# Patient Record
Sex: Male | Born: 1935 | Race: Black or African American | Hispanic: No | Smoking: Former smoker
Health system: Southern US, Community
[De-identification: ages and names within clinical notes are randomized; demographics above are authoritative.]

## PROBLEM LIST (undated history)

## (undated) DIAGNOSIS — C801 Malignant (primary) neoplasm, unspecified: Secondary | ICD-10-CM

## (undated) DIAGNOSIS — F0390 Unspecified dementia without behavioral disturbance: Secondary | ICD-10-CM

## (undated) DIAGNOSIS — K7460 Unspecified cirrhosis of liver: Secondary | ICD-10-CM

## (undated) DIAGNOSIS — I10 Essential (primary) hypertension: Secondary | ICD-10-CM

## (undated) DIAGNOSIS — K219 Gastro-esophageal reflux disease without esophagitis: Secondary | ICD-10-CM

## (undated) HISTORY — DX: Gastro-esophageal reflux disease without esophagitis: K21.9

## (undated) HISTORY — PX: APPENDECTOMY: SHX54

---

## 1999-04-15 ENCOUNTER — Encounter (INDEPENDENT_AMBULATORY_CARE_PROVIDER_SITE_OTHER): Payer: Self-pay | Admitting: *Deleted

## 1999-04-15 ENCOUNTER — Ambulatory Visit (HOSPITAL_COMMUNITY): Admission: RE | Admit: 1999-04-15 | Discharge: 1999-04-15 | Payer: Self-pay | Admitting: *Deleted

## 1999-08-10 ENCOUNTER — Encounter: Admission: RE | Admit: 1999-08-10 | Discharge: 1999-11-08 | Payer: Self-pay | Admitting: *Deleted

## 2001-07-04 ENCOUNTER — Ambulatory Visit (HOSPITAL_COMMUNITY): Admission: RE | Admit: 2001-07-04 | Discharge: 2001-07-04 | Payer: Self-pay | Admitting: *Deleted

## 2001-07-04 ENCOUNTER — Encounter (INDEPENDENT_AMBULATORY_CARE_PROVIDER_SITE_OTHER): Payer: Self-pay | Admitting: Specialist

## 2001-12-24 ENCOUNTER — Ambulatory Visit (HOSPITAL_COMMUNITY): Admission: RE | Admit: 2001-12-24 | Discharge: 2001-12-24 | Payer: Self-pay | Admitting: Internal Medicine

## 2002-12-24 ENCOUNTER — Ambulatory Visit: Admission: RE | Admit: 2002-12-24 | Discharge: 2003-01-25 | Payer: Self-pay | Admitting: Radiation Oncology

## 2003-09-29 ENCOUNTER — Emergency Department (HOSPITAL_COMMUNITY): Admission: EM | Admit: 2003-09-29 | Discharge: 2003-09-29 | Payer: Self-pay | Admitting: Family Medicine

## 2003-11-10 ENCOUNTER — Ambulatory Visit (HOSPITAL_COMMUNITY): Admission: RE | Admit: 2003-11-10 | Discharge: 2003-11-10 | Payer: Self-pay | Admitting: Urology

## 2004-03-17 ENCOUNTER — Emergency Department (HOSPITAL_COMMUNITY): Admission: EM | Admit: 2004-03-17 | Discharge: 2004-03-17 | Payer: Self-pay | Admitting: Emergency Medicine

## 2004-03-31 ENCOUNTER — Ambulatory Visit (HOSPITAL_COMMUNITY): Admission: RE | Admit: 2004-03-31 | Discharge: 2004-03-31 | Payer: Self-pay | Admitting: Oncology

## 2004-04-02 ENCOUNTER — Ambulatory Visit (HOSPITAL_COMMUNITY): Admission: RE | Admit: 2004-04-02 | Discharge: 2004-04-02 | Payer: Self-pay | Admitting: Urology

## 2004-12-01 ENCOUNTER — Ambulatory Visit (HOSPITAL_COMMUNITY): Admission: RE | Admit: 2004-12-01 | Discharge: 2004-12-01 | Payer: Self-pay | Admitting: Internal Medicine

## 2004-12-10 ENCOUNTER — Ambulatory Visit (HOSPITAL_COMMUNITY): Admission: RE | Admit: 2004-12-10 | Discharge: 2004-12-10 | Payer: Self-pay | Admitting: Urology

## 2005-12-14 ENCOUNTER — Emergency Department (HOSPITAL_COMMUNITY): Admission: EM | Admit: 2005-12-14 | Discharge: 2005-12-14 | Payer: Self-pay | Admitting: Emergency Medicine

## 2006-01-25 ENCOUNTER — Ambulatory Visit (HOSPITAL_COMMUNITY): Admission: RE | Admit: 2006-01-25 | Discharge: 2006-01-25 | Payer: Self-pay | Admitting: Urology

## 2006-07-14 ENCOUNTER — Ambulatory Visit: Payer: Self-pay | Admitting: Vascular Surgery

## 2006-07-25 ENCOUNTER — Ambulatory Visit (HOSPITAL_COMMUNITY): Admission: RE | Admit: 2006-07-25 | Discharge: 2006-07-25 | Payer: Self-pay | Admitting: Urology

## 2007-01-24 ENCOUNTER — Ambulatory Visit: Payer: Self-pay | Admitting: Vascular Surgery

## 2007-09-29 ENCOUNTER — Emergency Department (HOSPITAL_COMMUNITY): Admission: EM | Admit: 2007-09-29 | Discharge: 2007-09-29 | Payer: Self-pay | Admitting: Emergency Medicine

## 2008-02-01 ENCOUNTER — Ambulatory Visit (HOSPITAL_COMMUNITY): Admission: RE | Admit: 2008-02-01 | Discharge: 2008-02-01 | Payer: Self-pay | Admitting: Urology

## 2008-07-31 ENCOUNTER — Ambulatory Visit (HOSPITAL_COMMUNITY): Admission: RE | Admit: 2008-07-31 | Discharge: 2008-07-31 | Payer: Self-pay | Admitting: Urology

## 2008-09-06 ENCOUNTER — Inpatient Hospital Stay (HOSPITAL_COMMUNITY): Admission: EM | Admit: 2008-09-06 | Discharge: 2008-09-08 | Payer: Self-pay | Admitting: Emergency Medicine

## 2008-09-06 ENCOUNTER — Encounter: Payer: Self-pay | Admitting: Internal Medicine

## 2008-09-07 ENCOUNTER — Encounter: Payer: Self-pay | Admitting: Internal Medicine

## 2008-10-01 DIAGNOSIS — C61 Malignant neoplasm of prostate: Secondary | ICD-10-CM | POA: Insufficient documentation

## 2008-10-01 DIAGNOSIS — R002 Palpitations: Secondary | ICD-10-CM | POA: Insufficient documentation

## 2008-10-01 DIAGNOSIS — N189 Chronic kidney disease, unspecified: Secondary | ICD-10-CM | POA: Insufficient documentation

## 2008-10-01 DIAGNOSIS — R259 Unspecified abnormal involuntary movements: Secondary | ICD-10-CM | POA: Insufficient documentation

## 2008-10-01 DIAGNOSIS — I10 Essential (primary) hypertension: Secondary | ICD-10-CM | POA: Insufficient documentation

## 2008-10-01 DIAGNOSIS — E119 Type 2 diabetes mellitus without complications: Secondary | ICD-10-CM

## 2008-10-02 ENCOUNTER — Ambulatory Visit: Payer: Self-pay | Admitting: Internal Medicine

## 2008-10-03 ENCOUNTER — Encounter: Payer: Self-pay | Admitting: Internal Medicine

## 2008-10-17 ENCOUNTER — Emergency Department (HOSPITAL_COMMUNITY): Admission: EM | Admit: 2008-10-17 | Discharge: 2008-10-17 | Payer: Self-pay | Admitting: Emergency Medicine

## 2009-02-16 ENCOUNTER — Ambulatory Visit (HOSPITAL_COMMUNITY): Admission: RE | Admit: 2009-02-16 | Discharge: 2009-02-16 | Payer: Self-pay | Admitting: Urology

## 2009-04-03 ENCOUNTER — Ambulatory Visit: Payer: Self-pay | Admitting: Internal Medicine

## 2009-04-13 ENCOUNTER — Encounter: Payer: Self-pay | Admitting: Internal Medicine

## 2009-05-13 ENCOUNTER — Ambulatory Visit: Payer: Self-pay | Admitting: Internal Medicine

## 2009-05-21 ENCOUNTER — Telehealth: Payer: Self-pay | Admitting: Internal Medicine

## 2009-12-10 ENCOUNTER — Ambulatory Visit: Payer: Self-pay | Admitting: Internal Medicine

## 2009-12-14 ENCOUNTER — Ambulatory Visit: Payer: Self-pay | Admitting: Cardiovascular Disease

## 2010-04-17 ENCOUNTER — Encounter: Payer: Self-pay | Admitting: Urology

## 2010-04-25 LAB — CONVERTED CEMR LAB
Free T4: 0.9 ng/dL (ref 0.6–1.6)
Potassium: 4.3 meq/L (ref 3.5–5.1)
Sodium: 144 meq/L (ref 135–145)

## 2010-04-29 NOTE — Assessment & Plan Note (Signed)
Summary: 6 month rov/sl   Referring Provider:  Midge Minium Primary Provider:  Marisue Brooklyn, MD  CC:  6 month rov.  History of Present Illness: The patient presents today for routine cardiology followup. He reports doing very well since last being seen in our clinic. The patient denies symptoms of palpitations, chest pain, shortness of breath, orthopnea, PND, lower extremity edema, dizziness, presyncope, syncope, or neurologic sequela. The patient is tolerating medications without difficulties and is otherwise without complaint today.   Current Medications (verified): 1)  Benicar 40 Mg Tabs (Olmesartan Medoxomil) .... Take One Tablet By Mouth Daily 2)  Uroxatral 10 Mg Xr24h-Tab (Alfuzosin Hcl) .Marland Kitchen.. 1 By Mouth At Bedtime 3)  Januvia 100 Mg Tabs (Sitagliptin Phosphate) .Marland Kitchen.. 1 By Mouth Once Daily 4)  Nateglinide 120 Mg Tabs (Nateglinide) .Marland Kitchen.. 1 By Mouth Two Times A Day 5)  Lisinopril 5 Mg Tabs (Lisinopril) .... Take One Tablet By Mouth Daily 6)  Triamterene-Hctz 37.5-25 Mg Tabs (Triamterene-Hctz) .Marland Kitchen.. 1 By Mouth Once Daily 7)  Lansoprazole 30 Mg Cpdr (Lansoprazole) .Marland Kitchen.. 1 By Mouth Once Daily 8)  Aspirin 81 Mg Tbec (Aspirin) .... Take One Tablet By Mouth Daily 9)  Norvasc 10 Mg Tabs (Amlodipine Besylate) .... Take One Tablet Once Daily 10)  Coreg 6.25 Mg Tabs (Carvedilol) .... Take One Tablet Two Times A Day  Allergies (verified): No Known Drug Allergies  Past History:  Family History: Last updated: 10/02/2008 No known coronary artery disease but he was estranged   from most of his siblings and his parents.  He has no children.  His father died of unknown cause (possibly cancer) at age 61s.  His mother had diabetes.  Social History: Last updated: 10/02/2008  He is widowed having lost his wife to multiple  sclerosis 8 years ago.  He was a former pipe smoker but he quit 20 years   ago.  He does not use alcohol or recreational drugs.  His closest   friend's name is Ehrenfeld, lives  in [place], [place], near him.  He   also attends a [employer] which is supportive of him.   Past Medical History: CHRONIC KIDNEY DISEASE UNSPECIFIED (ICD-585.9) TREMOR (ICD-781.0) PROSTATE CANCER (ICD-185) DM (ICD-250.00) HYPERTENSION (ICD-401.9) H/O GASTRIC ULCERS SINUSITIS, ALLERGIC PALPITATIONS  Past Surgical History: Reviewed history from 10/02/2008 and no changes required. s/p appendectomy  Social History: Reviewed history from 10/02/2008 and no changes required.  He is widowed having lost his wife to multiple  sclerosis 8 years ago.  He was a former pipe smoker but he quit 20 years   ago.  He does not use alcohol or recreational drugs.  His closest   friend's name is [place], lives in [place], [place], near him.  He   also attends a [employer] which is supportive of him.   Review of Systems       All systems are reviewed and negative except as listed in the HPI.   Vital Signs:  Patient profile:   77 year old male Height:      65 inches Weight:      190 pounds Pulse rate:   96 / minute Pulse rhythm:   regular BP sitting:   172 / 76  (left arm) Cuff size:   regular  Vitals Entered By: Judithe Modest CMA (April 03, 2009 10:57 AM)  Physical Exam  General:  elderly, NAD Head:  normocephalic and atraumatic Eyes:  PERRLA/EOM intact; conjunctiva and lids normal. Mouth:  Teeth, gums and palate  normal. Oral mucosa normal. Neck:  supple, JVP 8 Lungs:  Clear bilaterally to auscultation and percussion. Heart:  RRR, 3/6 SEM mid peaking over LUSB with audible S2 Abdomen:  Bowel sounds positive; abdomen soft and non-tender without masses, organomegaly, or hernias noted. No hepatosplenomegaly. Msk:  Back normal, normal gait. Muscle strength and tone normal. Pulses:  pulses normal in all 4 extremities Extremities:  No clubbing or cyanosis.  trivial ble edema Neurologic:  Alert and oriented x 3.  strength/ sensation are intact Skin:  Intact without lesions or  rashes. Cervical Nodes:  no significant adenopathy Psych:  Normal affect.   EKG  Procedure date:  04/03/2009  Findings:      sinus 96 bpm, otherwise normal ekg  Impression & Recommendations:  Problem # 1:  HYPERTENSION (ICD-401.9) above goal We will stop diltiazem and start coreg 6.2m mg two times a day Will also start norvasc 10mg  daily  The following medications were removed from the medication list:    Diltiazem Hcl Er Beads 360 Mg Xr24h-cap (Diltiazem hcl er beads) .Marland Kitchen... 1 by mouth once daily His updated medication list for this problem includes:    Benicar 40 Mg Tabs (Olmesartan medoxomil) .Marland Kitchen... Take one tablet by mouth daily    Lisinopril 5 Mg Tabs (Lisinopril) .Marland Kitchen... Take one tablet by mouth daily    Triamterene-hctz 37.5-25 Mg Tabs (Triamterene-hctz) .Marland Kitchen... 1 by mouth once daily    Aspirin 81 Mg Tbec (Aspirin) .Marland Kitchen... Take one tablet by mouth daily    Norvasc 10 Mg Tabs (Amlodipine besylate) .Marland Kitchen... Take one tablet once daily    Coreg 6.25 Mg Tabs (Carvedilol) .Marland Kitchen... Take one tablet two times a day  Orders: TLB-BMP (Basic Metabolic Panel-BMET) (80048-METABOL) TLB-TSH (Thyroid Stimulating Hormone) (84443-TSH) TLB-T4 (Thyrox), Free [phone])  Problem # 2:  CHRONIC KIDNEY DISEASE UNSPECIFIED (ICD-585.9) obtain BMET today will need good blood pressure control  Problem # 3:  PALPITATIONS (ICD-785.1)  no recent palpitations will stop diltiazem but replace with coreg check TFTs  The following medications were removed from the medication list:    Diltiazem Hcl Er Beads 360 Mg Xr24h-cap (Diltiazem hcl er beads) .Marland Kitchen... 1 by mouth once daily His updated medication list for this problem includes:    Lisinopril 5 Mg Tabs (Lisinopril) .Marland Kitchen... Take one tablet by mouth daily    Aspirin 81 Mg Tbec (Aspirin) .Marland Kitchen... Take one tablet by mouth daily    Norvasc 10 Mg Tabs (Amlodipine besylate) .Marland Kitchen... Take one tablet once daily    Coreg 6.25 Mg Tabs (Carvedilol) .Marland Kitchen... Take one tablet two  times a day  Orders: TLB-BMP (Basic Metabolic Panel-BMET) (80048-METABOL) TLB-TSH (Thyroid Stimulating Hormone) (84443-TSH) TLB-T4 (Thyrox), Free [phone])  The following medications were removed from the medication list:    Diltiazem Hcl Er Beads 360 Mg Xr24h-cap (Diltiazem hcl er beads) .Marland Kitchen... 1 by mouth once daily His updated medication list for this problem includes:    Lisinopril 5 Mg Tabs (Lisinopril) .Marland Kitchen... Take one tablet by mouth daily    Aspirin 81 Mg Tbec (Aspirin) .Marland Kitchen... Take one tablet by mouth daily    Norvasc 10 Mg Tabs (Amlodipine besylate) .Marland Kitchen... Take one tablet once daily    Coreg 6.25 Mg Tabs (Carvedilol) .Marland Kitchen... Take one tablet two times a day  Patient Instructions: 1)  Your physician recommends that you return for lab work in: 6 weeks for BMET, TSH, T4  --401.1/585.9 2)  Your physician has recommended you make the following change in your medication:  3)  STOP Diltiazem 4)  Start Norvasc 10 mg daily 5)  Start Coreg 6.25 two times a day  6)  These prescriptions have been electronically faxed to your pharmacy: [facility] in [place], [place] (per your [employer] insurance card) Prescriptions: COREG 6.25 MG TABS (CARVEDILOL) take one tablet two times a day  #90 x 4   Entered by:   Duncan Dull, RN, BSN   Authorized by:   Hillis Range, MD   Signed by:   Duncan Dull, RN, BSN on 04/03/2009   Method used:   Faxed to ...       [facility]       [address] [place], [place]  [address]       Ph: [phone]       Fax: [phone]   RxID:   [id number] NORVASC 10 MG TABS (AMLODIPINE BESYLATE) take one tablet once daily  #90 x 4   Entered by:   Duncan Dull, RN, BSN   Authorized by:   Hillis Range, MD   Signed by:   Duncan Dull, RN, BSN on 04/03/2009   Method used:   Faxed to ...       [facility] ([facility])       [address] [place], [place]  [address]       Ph: [phone]       Fax: [phone]   RxID:   [id number]

## 2010-04-29 NOTE — Assessment & Plan Note (Signed)
Summary: [name]   Visit Type:  Follow-up Referring Provider:  Midge Minium Primary Provider:  Marisue Brooklyn, MD   History of Present Illness: The patient presents today for routine cardiology followup. He reports doing very well since last being seen in our clinic. The patient denies symptoms of palpitations, chest pain, shortness of breath, orthopnea, PND, lower extremity edema, dizziness, presyncope, syncope, or neurologic sequela. The patient is tolerating medications without difficulties and is otherwise without complaint today.  He is unable to recall which medicines he is taking and does not recall starting amlodipine as instructed on last visit.  Current Medications (verified): 1)  Benicar 40 Mg Tabs (Olmesartan Medoxomil) .... Take One Tablet By Mouth Daily 2)  Uroxatral 10 Mg Xr24h-Tab (Alfuzosin Hcl) .Marland Kitchen.. 1 By Mouth At Bedtime 3)  Januvia 100 Mg Tabs (Sitagliptin Phosphate) .Marland Kitchen.. 1 By Mouth Once Daily 4)  Starlix 120 Mg Tabs (Nateglinide) .... Two Times A Day 5)  Lisinopril 5 Mg Tabs (Lisinopril) .... Take One Tablet By Mouth Daily 6)  Triamterene-Hctz 37.5-25 Mg Tabs (Triamterene-Hctz) .... 1/2 By Mouth Once Daily 7)  Lansoprazole 30 Mg Cpdr (Lansoprazole) .Marland Kitchen.. 1 By Mouth Once Daily 8)  Diltiazem Hcl Er Beads 360 Mg Xr24h-Cap (Diltiazem Hcl Er Beads) .... Take One Capsule By Mouth Daily 9)  Coreg 6.25 Mg Tabs (Carvedilol) .... Take One Tablet Two Times A Day 10)  Lantus Solostar 100 Unit/ml Soln (Insulin Glargine) .... Uad  Allergies (verified): No Known Drug Allergies  Past History:  Past Medical History: Reviewed history from 04/03/2009 and no changes required. CHRONIC KIDNEY DISEASE UNSPECIFIED (ICD-585.9) TREMOR (ICD-781.0) PROSTATE CANCER (ICD-185) DM (ICD-250.00) HYPERTENSION (ICD-401.9) H/O GASTRIC ULCERS SINUSITIS, ALLERGIC PALPITATIONS  Past Surgical History: Reviewed history from 10/02/2008 and no changes required. s/p appendectomy  Social  History: Reviewed history from 05/13/2009 and no changes required.  He is widowed having lost his wife to multiple  sclerosis 8 years ago.  He was a former pipe smoker but he quit 20 years   ago.  He does not use alcohol or recreational drugs.  His closest   friend's name is Argenta, lives in [place], [place], near him.  He   also attends a [employer] which is supportive of him.  Pt lives alone.  Review of Systems       All systems are reviewed and negative except as listed in the HPI.   Vital Signs:  Patient profile:   77 year old male Height:      65 inches Weight:      191 pounds BMI:     31.90 Pulse rate:   60 / minute BP sitting:   150 / 70  (left arm)  Vitals Entered By: Laurance Flatten CMA (December 10, 2009 11:16 AM)  Physical Exam  General:  elderly, NAD Head:  normocephalic and atraumatic Eyes:  PERRLA/EOM intact; conjunctiva and lids normal. Mouth:  Teeth, gums and palate normal. Oral mucosa normal. Neck:  supple, JVP 8 Lungs:  Clear bilaterally to auscultation and percussion. Heart:  RRR, 3/6 SEM mid peaking over LUSB with audible S2 Abdomen:  Bowel sounds positive; abdomen soft and non-tender without masses, organomegaly, or hernias noted. No hepatosplenomegaly. Msk:  Back normal, normal gait. Muscle strength and tone normal. Pulses:  pulses normal in all 4 extremities Extremities:  No clubbing or cyanosis.  trivial ble edema Neurologic:  Alert and oriented x 3.  strength/ sensation are intact   EKG  Procedure date:  12/10/2009  Findings:  sinus rhythm 60 bpm, PR 182, Qtc 404 no ischemic changes  Impression & Recommendations:  Problem # 1:  HYPERTENSION (ICD-401.9) above goal salt restriction will stop cardizem and start norvasc as instructed last visit  I am concerned about compliance.  I will ask our pharmacist to schedule a medicine reconciliation visit with the patient to review medications and assist with compliance.  Problem # 2:   PALPITATIONS (ICD-785.1) resolved  Problem # 3:  CHRONIC KIDNEY DISEASE UNSPECIFIED (ICD-585.9) stable adequate BP control is necessary  Other Orders: EKG w/ Interpretation (93000)  Patient Instructions: 1)  Your physician has recommended you make the following change in your medication: Stop diltiazem 360mg  and Start Amlodipine 10mg . 2)  Your physician recommends that you have a  pharmacy medication reconciliation visit. 3)  Your physician recommends that you schedule a follow-up appointment in: 6 months. Prescriptions: NORVASC 10 MG TABS (AMLODIPINE BESYLATE) take one tablet once daily  #90 x 3   Entered by:   Laurance Flatten CMA   Authorized by:   Hillis Range, MD   Signed by:   Laurance Flatten CMA on 12/10/2009   Method used:   Faxed to ...       [facility]       [address] [place], [place]  [address]       Ph: [phone]       Fax: [phone]   RxID:   [id number]

## 2010-04-29 NOTE — Progress Notes (Signed)
Summary: refill meds  Phone Note Refill Request Call back at Home Phone [phone] Message from:  Patient on May 21, 2009 10:25 AM  Refills Requested: Medication #1:  COREG 6.25 MG TABS take one tablet two times a day. [facility] .    Method Requested: Fax to Mail Away Pharmacy Initial call taken by: Lorne Skeens,  May 21, 2009 10:26 AM    Prescriptions: COREG 6.25 MG TABS (CARVEDILOL) take one tablet two times a day  #180 x 3   Entered by:   Layne Benton, RN, BSN   Authorized by:   Hillis Range, MD   Signed by:   Layne Benton, RN, BSN on 05/21/2009   Method used:   Faxed to ...       [facility]       [address] [place], [place]  [address]       Ph: [phone]       Fax: [phone]   RxID:   [id number]

## 2010-04-29 NOTE — Assessment & Plan Note (Signed)
Summary: MTM per Dr. Johney Frame   Referring Provider:  Midge Minium Primary Provider:  Marisue Brooklyn, MD   History of Present Illness: [name] visits the clinic today for a medication reconciliation appointment. Pt was seen last week by Dr. Johney Frame, who reports that the patient was unable to recall which medications he was taking and does not recall starting amlodipine as instructed on last visit. The patient's prescribed blood pressure medications are extensive, but despite treatment with mutliple agents, his blood pressure seems to remain uncontrolled. [name] gets his medication through [facility] mail order [facility] and is set up to get automatic refills. As a result, [name] has multiple bottles of the same medications at home and is frustrated that the pharmacy continues to send him more. He expressed interest in being able to request his own refills. Pt also reports taking a cold medication containing phenylephrine often at bedtime. Pt states that he is following low salt diet and denies adding salt to any of his foods. He also reports using fresh or frozen vegetables and does not eat much canned food. [name] does check his BP at home, but did not bring in any readings today.   Current Medications (verified): 1)  Benicar 40 Mg Tabs (Olmesartan Medoxomil) .... Take One Tablet By Mouth Daily 2)  Uroxatral 10 Mg Xr24h-Tab (Alfuzosin Hcl) .Marland Kitchen.. 1 By Mouth At Bedtime 3)  Januvia 100 Mg Tabs (Sitagliptin Phosphate) .Marland Kitchen.. 1 By Mouth Once Daily 4)  Starlix 120 Mg Tabs (Nateglinide) .... Two Times A Day 5)  Lisinopril 5 Mg Tabs (Lisinopril) .... Take One Tablet By Mouth Daily 6)  Triamterene-Hctz 37.5-25 Mg Tabs (Triamterene-Hctz) .... 1/2 By Mouth Once Daily 7)  Lansoprazole 30 Mg Cpdr (Lansoprazole) .Marland Kitchen.. 1 By Mouth Once Daily 8)  Amlodipine Besylate 10 Mg Tabs (Amlodipine Besylate) .... Take One Tablet By Mouth Daily 9)  Coreg 6.25 Mg Tabs (Carvedilol) .... Take One Tablet Two Times A  Day 10)  Lantus Solostar 100 Unit/ml Soln (Insulin Glargine) .... Uad  Allergies: No Known Drug Allergies  Vital Signs:  Patient profile:   77 year old male Pulse rate:   60 / minute BP sitting:   152 / 68   Impression & Recommendations:  Problem # 1:  HYPERTENSION (ICD-401.9) [name] presents today with uncontrolled hypertension (BP 152/68, goal <130/80) treated with several medications, including Benicar, Triamterene/HCTZ, and carvedilol. Pt brought in all medications to clinic with him today. After an extensive review of all of the pt's medications, it is discovered that pt is not taking lisinopril as listed in medication list and has not yet received the amlodipine Rx in the mail. Pt also reports taking a phenylephrine-containing cold medication often at bedtime, which may be contributing to elevated BP. All expired and unnecessary medications were taken from the patient and the correct and up-to-date medications were put in his pill box for him. Pt was counseled and reminded again of the importance of following a low-salt diet. Pt was discouraged from using any cold medications containing phenylephrine or pseudoephedrine and encouraged to use Coricidin BP or other similar products for cold/congestion. [facility] was also called to try to get [name] off of automatic refill, but per HIPAA regulations, the pt must call and request this himself.  It is unknown if pt is supposed to be taking both lisinopril and Benicar. We will f/u with Dr. Johney Frame and address at next visit with patient.  Pt instructed to begin taking amlodipine when  the Rx arrives, d/c cold medicine, and bring in home BP readings at next visit. F/U: Pt will RTC in 2 weeks.  His updated medication list for this problem includes:    Benicar 40 Mg Tabs (Olmesartan medoxomil) .Marland Kitchen... Take one tablet by mouth daily    Lisinopril 5 Mg Tabs (Lisinopril) .Marland Kitchen... Take one tablet by mouth daily (Not currently taking)     Triamterene-hctz 37.5-25 Mg Tabs (Triamterene-hctz) .Marland Kitchen... 1 by mouth once daily    Amlodipine Besylate 10 Mg Tabs (Amlodipine besylate) .Marland Kitchen... Take one tablet by mouth daily    Coreg 6.25 Mg Tabs (Carvedilol) .Marland Kitchen... Take one tablet two times a day  Patient Instructions: 1)  Your current medications include: 2)  Benicar 40 mg ... Take 1 tablet by mouth daily 3)  Uroxatral 10 mg ... Take 1 tablet by mouth at bedtime 4)  Januvia 100 mg ... Take 1 tablet by mouth daily 5)  Starlix 120 mg ... Take 1 tablet two times daily 6)  Triamterene-HCTZ 37.5-25 mg ... Take 1 tablet by mouth daily 7)  Lansoprazole 30 mg ... Take 1 tablet by mouth daily 8)  Carvedilol 6.25 mg ... Take 1 tablet by mouth twice daily 9)  Lantus 100 unit/ml ... Use as directed 10)  Restasis ... Apply 1 drop in both eyes twice daily 11)  Amlodipine 10 mg .Marland KitchenMarland KitchenTake 1 tablet by mouth daily 12)  Do NOT take any more cold medicine containing phenylephrine or pseudoephedrine. Instead, take Coricidin BP as needed for cold/congestion.  13)  You should be receiving your amlodipine prescription in the mail within the next few days. Remember to add this to your pill box and take once daily.  14)  Check blood pressure twice daily and record results. Bring your list of results with you to the next appointment.  15)  Return to clinic in 2 weeks.

## 2010-04-29 NOTE — Assessment & Plan Note (Signed)
Summary: per check out/sf   Visit Type:  rov Referring Provider:  Midge Minium Primary Provider:  Marisue Brooklyn, MD  CC:  no cardiac complaints today.  History of Present Illness: The patient presents today for routine cardiology followup. He reports doing very well since last being seen in our clinic. The patient denies symptoms of palpitations, chest pain, shortness of breath, orthopnea, PND, lower extremity edema, dizziness, presyncope, syncope, or neurologic sequela. The patient is tolerating medications without difficulties and is otherwise without complaint today.   Current Medications (verified): 1)  Benicar 40 Mg Tabs (Olmesartan Medoxomil) .... Take One Tablet By Mouth Daily 2)  Uroxatral 10 Mg Xr24h-Tab (Alfuzosin Hcl) .Marland Kitchen.. 1 By Mouth At Bedtime 3)  Januvia 100 Mg Tabs (Sitagliptin Phosphate) .Marland Kitchen.. 1 By Mouth Once Daily 4)  Nateglinide 120 Mg Tabs (Nateglinide) .Marland Kitchen.. 1 By Mouth Two Times A Day 5)  Lisinopril 5 Mg Tabs (Lisinopril) .... Take One Tablet By Mouth Daily 6)  Triamterene-Hctz 37.5-25 Mg Tabs (Triamterene-Hctz) .Marland Kitchen.. 1 By Mouth Once Daily 7)  Lansoprazole 30 Mg Cpdr (Lansoprazole) .Marland Kitchen.. 1 By Mouth Once Daily 8)  Aspirin 81 Mg Tbec (Aspirin) .... Take One Tablet By Mouth Daily 9)  Norvasc 10 Mg Tabs (Amlodipine Besylate) .... Take One Tablet Once Daily 10)  Coreg 6.25 Mg Tabs (Carvedilol) .... Take One Tablet Two Times A Day  Allergies (verified): No Known Drug Allergies  Past History:  Past Medical History: Reviewed history from 04/03/2009 and no changes required. CHRONIC KIDNEY DISEASE UNSPECIFIED (ICD-585.9) TREMOR (ICD-781.0) PROSTATE CANCER (ICD-185) DM (ICD-250.00) HYPERTENSION (ICD-401.9) H/O GASTRIC ULCERS SINUSITIS, ALLERGIC PALPITATIONS  Past Surgical History: Reviewed history from 10/02/2008 and no changes required. s/p appendectomy  Social History:  He is widowed having lost his wife to multiple  sclerosis 8 years ago.  He was a former  pipe smoker but he quit 20 years   ago.  He does not use alcohol or recreational drugs.  His closest   friend's name is Leilani [place], lives in [place], [place], near him.  He   also attends a [employer] which is supportive of him.  Pt lives alone.  Review of Systems       All systems are reviewed and negative except as listed in the HPI.   Vital Signs:  Patient profile:   77 year old male Height:      65 inches Weight:      192 pounds Pulse rate:   98 / minute Pulse rhythm:   regular BP sitting:   162 / 60  (left arm) Cuff size:   large  Vitals Entered By: Danielle Rankin, CMA (May 13, 2009 9:43 AM)  Physical Exam  General:  elderly, NAD Head:  normocephalic and atraumatic Eyes:  PERRLA/EOM intact; conjunctiva and lids normal. Nose:  no deformity, discharge, inflammation, or lesions Mouth:  Teeth, gums and palate normal. Oral mucosa normal. Neck:  supple, JVP 8 Lungs:  Clear bilaterally to auscultation and percussion. Heart:  RRR, 3/6 SEM mid peaking over LUSB with audible S2 Abdomen:  Bowel sounds positive; abdomen soft and non-tender without masses, organomegaly, or hernias noted. No hepatosplenomegaly. Msk:  Back normal, normal gait. Muscle strength and tone normal. Pulses:  pulses normal in all 4 extremities Extremities:  No clubbing or cyanosis.  trivial ble edema Neurologic:  Alert and oriented x 3.  strength/ sensation are intact Skin:  Intact without lesions or rashes. Cervical Nodes:  no significant adenopathy Psych:  Normal affect.   Impression &  Recommendations:  Problem # 1:  HYPERTENSION (ICD-401.9) Above goal today. I have recommended that he increase coreg to 12.5mg  two times a day however the patient declines any changes in medicine today. I have also recommended salt restriction.  His updated medication list for this problem includes:    Benicar 40 Mg Tabs (Olmesartan medoxomil) .Marland Kitchen... Take one tablet by mouth daily    Lisinopril 5 Mg Tabs  (Lisinopril) .Marland Kitchen... Take one tablet by mouth daily    Triamterene-hctz 37.5-25 Mg Tabs (Triamterene-hctz) .Marland Kitchen... 1 by mouth once daily    Aspirin 81 Mg Tbec (Aspirin) .Marland Kitchen... Take one tablet by mouth daily    Norvasc 10 Mg Tabs (Amlodipine besylate) .Marland Kitchen... Take one tablet once daily    Coreg 6.25 Mg Tabs (Carvedilol) .Marland Kitchen... Take one tablet two times a day  Problem # 2:  PALPITATIONS (ICD-785.1) Resolved could increase coreg in the future if palpitations return.  His updated medication list for this problem includes:    Lisinopril 5 Mg Tabs (Lisinopril) .Marland Kitchen... Take one tablet by mouth daily    Aspirin 81 Mg Tbec (Aspirin) .Marland Kitchen... Take one tablet by mouth daily    Norvasc 10 Mg Tabs (Amlodipine besylate) .Marland Kitchen... Take one tablet once daily    Coreg 6.25 Mg Tabs (Carvedilol) .Marland Kitchen... Take one tablet two times a day  Problem # 3:  CHRONIC KIDNEY DISEASE UNSPECIFIED (ICD-585.9) stable tight blood pressure control is recommended, though the patient states "I dont want no more pills" today.  Patient Instructions: 1)  Your physician recommends that you schedule a follow-up appointment in: 6months with Dr Johney Frame 2)  Your physician has requested that you limit the intake of sodium (salt) in your diet to two grams daily. Please see [facility] handout.

## 2010-06-10 ENCOUNTER — Ambulatory Visit: Payer: Self-pay | Admitting: Internal Medicine

## 2010-06-17 ENCOUNTER — Encounter (INDEPENDENT_AMBULATORY_CARE_PROVIDER_SITE_OTHER): Payer: Self-pay | Admitting: *Deleted

## 2010-06-24 NOTE — Letter (Signed)
Summary: Appointment - Missed   [facility]  [address]   [place], [place] [address]   Phone: [phone]  Fax: [phone]     June 17, 2010 MRN: [id number]   [name] [address] [facility] [place], [place]  [address]   Dear [name],  Our records indicate you missed your appointment on 06-10-10  with Dr.  Johney Frame .                                    It is very important that we reach you to reschedule this appointment. We look forward to participating in your health care needs. Please contact us at the number listed above at your earliest convenience to reschedule this appointment.     Sincerely,    Glass blower/designer

## 2010-07-04 LAB — GLUCOSE, CAPILLARY
Glucose-Capillary: 46 mg/dL — ABNORMAL LOW (ref 70–99)
Glucose-Capillary: 67 mg/dL — ABNORMAL LOW (ref 70–99)

## 2010-07-04 LAB — COMPREHENSIVE METABOLIC PANEL
ALT: 11 U/L (ref 0–53)
Albumin: 3.6 g/dL (ref 3.5–5.2)
Alkaline Phosphatase: 116 U/L (ref 39–117)
BUN: 29 mg/dL — ABNORMAL HIGH (ref 6–23)
Calcium: 8.6 mg/dL (ref 8.4–10.5)
Chloride: 105 mEq/L (ref 96–112)
Creatinine, Ser: 2.14 mg/dL — ABNORMAL HIGH (ref 0.4–1.5)
GFR calc Af Amer: 35 mL/min — ABNORMAL LOW (ref >60)
GFR calc non Af Amer: 29 mL/min — ABNORMAL LOW (ref >60)
Potassium: 3.6 mEq/L (ref 3.5–5.1)
Sodium: 130 mEq/L — ABNORMAL LOW (ref 135–145)
Total Bilirubin: 0.2 mg/dL — ABNORMAL LOW (ref 0.3–1.2)
Total Protein: 6.7 g/dL (ref 6.0–8.3)

## 2010-07-04 LAB — DIFFERENTIAL
Basophils Relative: 0 % (ref 0–1)
Eosinophils Absolute: 0.2 10*3/uL (ref 0.0–0.7)
Eosinophils Relative: 3 % (ref 0–5)
Lymphocytes Relative: 36 % (ref 12–46)
Monocytes Absolute: 0.5 10*3/uL (ref 0.1–1.0)
Neutrophils Relative %: 54 % (ref 43–77)

## 2010-07-04 LAB — CBC
RBC: 3.89 MIL/uL — ABNORMAL LOW (ref 4.22–5.81)
RDW: 13.2 % (ref 11.5–15.5)

## 2010-07-04 LAB — HEMOCCULT GUIAC POC 1CARD (OFFICE): Fecal Occult Bld: NEGATIVE

## 2010-07-04 LAB — LIPASE, BLOOD: Lipase: 53 U/L (ref 11–59)

## 2010-07-05 LAB — GLUCOSE, CAPILLARY
Glucose-Capillary: 106 mg/dL — ABNORMAL HIGH (ref 70–99)
Glucose-Capillary: 161 mg/dL — ABNORMAL HIGH (ref 70–99)
Glucose-Capillary: 184 mg/dL — ABNORMAL HIGH (ref 70–99)
Glucose-Capillary: 249 mg/dL — ABNORMAL HIGH (ref 70–99)

## 2010-07-05 LAB — CBC
HCT: 33 % — ABNORMAL LOW (ref 39.0–52.0)
Hemoglobin: 11.4 g/dL — ABNORMAL LOW (ref 13.0–17.0)
MCHC: 33.8 g/dL (ref 30.0–36.0)
MCV: 90.2 fL (ref 78.0–100.0)
Platelets: 125 10*3/uL — ABNORMAL LOW (ref 150–400)
Platelets: 136 10*3/uL — ABNORMAL LOW (ref 150–400)
RBC: 3.66 MIL/uL — ABNORMAL LOW (ref 4.22–5.81)
RDW: 13.9 % (ref 11.5–15.5)
WBC: 5.2 10*3/uL (ref 4.0–10.5)

## 2010-07-05 LAB — COMPREHENSIVE METABOLIC PANEL
BUN: 25 mg/dL — ABNORMAL HIGH (ref 6–23)
CO2: 23 mEq/L (ref 19–32)
Chloride: 112 mEq/L (ref 96–112)
Creatinine, Ser: 1.72 mg/dL — ABNORMAL HIGH (ref 0.4–1.5)
GFR calc non Af Amer: 37 mL/min — ABNORMAL LOW (ref >60)
Glucose, Bld: 156 mg/dL — ABNORMAL HIGH (ref 70–99)
Total Bilirubin: 0.4 mg/dL (ref 0.3–1.2)

## 2010-07-05 LAB — DIFFERENTIAL
Eosinophils Absolute: 0 10*3/uL (ref 0.0–0.7)
Lymphs Abs: 0.7 10*3/uL (ref 0.7–4.0)
Neutrophils Relative %: 80 % — ABNORMAL HIGH (ref 43–77)

## 2010-07-05 LAB — CARDIAC PANEL(CRET KIN+CKTOT+MB+TROPI)
CK, MB: 5.5 ng/mL — ABNORMAL HIGH (ref 0.3–4.0)
CK, MB: 6.2 ng/mL — ABNORMAL HIGH (ref 0.3–4.0)
Relative Index: 2.5 (ref 0.0–2.5)
Relative Index: 3.4 — ABNORMAL HIGH (ref 0.0–2.5)
Total CK: 155 U/L (ref 7–232)
Total CK: 190 U/L (ref 7–232)
Troponin I: 0.02 ng/mL (ref 0.00–0.06)

## 2010-07-05 LAB — LIPID PANEL
Cholesterol: 122 mg/dL (ref 0–200)
HDL: 31 mg/dL — ABNORMAL LOW (ref >39)
Triglycerides: 40 mg/dL (ref <150)
Triglycerides: 79 mg/dL (ref <150)

## 2010-07-05 LAB — BASIC METABOLIC PANEL
BUN: 31 mg/dL — ABNORMAL HIGH (ref 6–23)
CO2: 23 mEq/L (ref 19–32)
Calcium: 8.5 mg/dL (ref 8.4–10.5)
Chloride: 112 mEq/L (ref 96–112)
GFR calc Af Amer: 44 mL/min — ABNORMAL LOW (ref >60)
GFR calc non Af Amer: 36 mL/min — ABNORMAL LOW (ref >60)

## 2010-07-05 LAB — POCT CARDIAC MARKERS
CKMB, poc: 1.9 ng/mL (ref 1.0–8.0)
CKMB, poc: 1.9 ng/mL (ref 1.0–8.0)
Myoglobin, poc: 221 ng/mL (ref 12–200)
Troponin i, poc: <0.05 ng/mL (ref 0.00–0.09)
Troponin i, poc: <0.05 ng/mL (ref 0.00–0.09)

## 2010-07-05 LAB — TSH: TSH: 1.191 u[IU]/mL (ref 0.350–4.500)

## 2010-07-05 LAB — CK TOTAL AND CKMB (NOT AT ARMC)
CK, MB: 6.1 ng/mL — ABNORMAL HIGH (ref 0.3–4.0)
Relative Index: 4.2 — ABNORMAL HIGH (ref 0.0–2.5)

## 2010-07-05 LAB — RETICULOCYTES: Retic Count, Absolute: 38.2 10*3/uL (ref 19.0–186.0)

## 2010-07-05 LAB — IRON AND TIBC
Iron: 90 ug/dL (ref 42–135)
Saturation Ratios: 39 % (ref 20–55)
TIBC: 232 ug/dL (ref 215–435)

## 2010-07-05 LAB — TROPONIN I: Troponin I: 0.06 ng/mL (ref 0.00–0.06)

## 2010-08-10 NOTE — Discharge Summary (Signed)
NAME:  [name], [name]                ACCOUNT NO.:  [id number]   MEDICAL RECORD NO.:  [id number]          PATIENT TYPE:  INP   LOCATION:  3705                         FACILITY:  [facility]   PHYSICIAN:  Eduard Clos, MDDATE OF BIRTH:  [birth year]   DATE OF ADMISSION:  09/06/2008  DATE OF DISCHARGE:  09/08/2008                               DISCHARGE SUMMARY   COURSE IN THE HOSPITAL:  A 77 year old male with known history of  hypertension, diabetes mellitus type II, history of CA prostate was  brought into ER after the patient had some sensation of palpitation,  some blurry vision.  The patient was admitted to medical floor, was  observed in the telemetry floor for more than 24 hours.  There was no  arrhythmias observed, cardiac enzymes were done, which were all  negative.  At this time, as the patient is completely asymptomatic and  is eager to go home, will be discharged home with his present home  medications.  I also set up an appointment with Dr. Johney Frame of [facility]  [facility] on October 02, 2008, and advised the patient to follow this, for  which the patient agreed.  At this time, as the patient is asymptomatic,  the patient will be discharged home.   PERTINENT LABORATORY DATA:  Cardiac enzymes were negative.  Most recent  chest x-ray on September 06, 2008, showed mild  patchy atelectasis or  infiltrates.  The patient has no symptoms for this.   FINAL DIAGNOSES:  1. Palpitation.  2. Chronic kidney disease.  3. Hypertension.  4. Diabetes mellitus type II.  5. Cancer of prostate.   MEDICATIONS AT DISCHARGE:  1. Januvia 100 mg p.o. daily.  2. Lantus insulin 5 units subcutaneous at bedtime.  3. Restasis 0.05% apply p.r.n. to the eyes.  4. Nateglinide 120 mg p.o. b.i.d.  5. Uroxatral 10 mg p.o. daily.  6. Lisinopril 5 mg p.o. daily.   PLAN:  The patient advised to follow up with Dr. Marisue Brooklyn within a  week's time to recheck his BMET.  To follow up with Dr. Johney Frame of  [facility]iology on October 02, 2008, at 10:15 a.m. for further workup on  his cardiac symptoms which presently is resolved.  The patient is to be  on a cardiac-healthy, carb-modified diet, check blood sugar at a.c. and  at bedtime.  Activity as tolerated.     Eduard Clos, MD  Electronically Signed    ANK/MEDQ  D:  09/08/2008  T:  09/08/2008  Job:  [id number]   cc:   Lovenia Kim, D.O.

## 2010-08-10 NOTE — H&P (Signed)
NAME:  [name], [name]                ACCOUNT NO.:  [id number]   MEDICAL RECORD NO.:  [id number]          PATIENT TYPE:  EMS   LOCATION:  MAJO                         FACILITY:  [facility]   PHYSICIAN:  Acey Lav, MD  DATE OF BIRTH:  [birth year]   DATE OF ADMISSION:  09/06/2008  DATE OF DISCHARGE:                              HISTORY & PHYSICAL   PRIMARY CARE PHYSICIAN:  Lovenia Kim, D.O.   CHIEF COMPLAINT:  Palpitations.   HISTORY OF PRESENT ILLNESS:  [name] is a 77 year old African  American veteran with past medical history significant for hypertension,  diabetes mellitus and prostate cancer who presented with acute onset  this morning while he was in the midst of preparing breakfast seated at  a chair of sensation of his heart pounding.  This was accompanied also  by some blurry vision.  The heart pounding sensation continued for  approximately an hour.  He denied chest tightness or discomfort.  He  denied nausea or emesis or diaphoresis.  He was brought to the hospital  by one of his friends and in the emergency room his cardiac markers were  found to be negative.  EKG showed some left ventricular hypertrophy with  some ST-segment depression less than a millimeter in V6 and some T-wave  inversion in 3, otherwise no acute changes.  In the emergency room he  was asymptomatic but there was concern on the part of his friends and  himself about possibility of active coronary disease.  We were asked to  admit him to the hospital to work up his symptoms of palpitations and  blurry vision.   PAST MEDICAL HISTORY:  1. Hypertension.  2. Diabetes mellitus.  3. Prostate cancer.  4. Tremor which he has had for quite some time.   PAST SURGICAL HISTORY:  No recent surgeries.   FAMILY HISTORY:  No known coronary artery disease but he was estranged  from most of his siblings and his parents.  He has no children.   SOCIAL HISTORY:  He is widowed having lost his wife to multiple  sclerosis 8 years ago.  He was a former pipe smoker but he quit 20 years  ago.  He does not use alcohol or recreational drugs.  His closest  friend's name is [place], lives in [place], [place], near him.  He  also attends a [employer] which is supportive of him.   REVIEW OF SYSTEMS:  As described in history of present illness, also  pertinent for some intermittent nonproductive cough which he states is a  problem of his having issues with chronic allergies and post sinus drip.  He has had no fevers, chills, nausea, vomiting, diarrhea.  He has had no  significant weight loss.   PHYSICAL EXAMINATION:  VITAL SIGNS:  Blood pressure in the emergency  room when I saw him 152/62, pulse 64, respirations 16, pulse ox was 100%  on room air, temperature max 97.3.  GENERAL:  A quite pleasant gentleman in no acute distress.  HEENT:  He is normocephalic other than a slightly raised area on his  left forehead which has been chronic.  His pupils are equal, round and  reactive to light.  Sclerae anicteric.  Oropharynx is clear.  NECK:  Neck is supple.  CARDIOVASCULAR:  Revealed a 2/6 systolic murmur at the right upper  sternal border without radiation.  LUNGS:  Were clear to auscultation bilaterally.  ABDOMEN:  Soft, nondistended, nontender.  EXTREMITIES:  1+ pretibial edema.   LABORATORY DATA:  EKG shows normal sinus rhythm with left ventricular  hypertrophy, ST-segment depression less than a millimeter in V6, T-wave  inversion in lead 3, otherwise no EKG findings other than he did also  actually have T-wave inversion in V1 and he does actually also have what  appears to be a Q in lead V1 and biphasic T-wave there.  Was uncertain  how old these changes are, we have no EKGs for comparison.  Cardiac  markers were negative.  Metabolic panel - sodium 141, potassium 3.1,  chloride 112, bicarb 23, BUN and creatinine were 31 and 1.78, glucose  82.  Initial cardiac markers were only pertinent for  myoglobin of 221  but troponin and CK-MB were negative and markers obtained 1 hour later  were essentially unchanged, myoglobin only having gone up to 231.  CBC -  differential white count 6.3, hemoglobin 11.2, hematocrit 33.3,  platelets 136, ANC 5.   ASSESSMENT AND PLAN:  This is a 78 year old African American veteran  with a past medical history significant for prostate cancer, diabetes  mellitus, hypertension who presents with acute onset of palpitations  accompanied by blurry vision this morning.  He has never had episode of  this before.  1. Palpitations and blurry vision:  This is suspicious for some type      of arrhythmia including paroxysmal atrial fibrillation or other      atrial tachycardia or potentially some other supraventricular      tachycardia or other heart arrhythmia.  He does not have evidence      of ongoing ischemia.  I am going to admit him to the hospitalist      service today.  We will place him on telemetry.  We will cycle his      cardiac enzymes x3 and we will touch base with cardiology in the      morning regarding further workup.  I am going to given some fluid      as he does appear to have a little bit of renal failure.  We will      hold his antihypertensive medications at this point in time.  We      will give him a baby aspirin.  2. Diabetes mellitus.  I am going to hold his home medications for now      just because it seems like he may have a little bit of renal      failure.  I will put him on sliding scale insulin.  3. Hypertension.  As described above I am going to hold his ARB for      right now.  4. Prostate cancer.  I will continue home meds.  5. Anemia.  I will send out an anemia panel, guaiac his stools.  6. Thrombocytopenia.  Uncertain if this is chronic or not.  Will      repeat his labs.  7. Prophylax with heparin three times daily.   CODE STATUS:  The patient is a full code.      Acey Lav, MD  Electronically  Signed  CV/MEDQ  D:  09/06/2008  T:  09/06/2008  Job:  [id number]   cc:   Lovenia Kim, D.O.

## 2011-11-15 ENCOUNTER — Other Ambulatory Visit (HOSPITAL_COMMUNITY): Payer: Self-pay | Admitting: Urology

## 2011-11-15 DIAGNOSIS — C61 Malignant neoplasm of prostate: Secondary | ICD-10-CM

## 2011-11-23 ENCOUNTER — Encounter (HOSPITAL_COMMUNITY)
Admission: RE | Admit: 2011-11-23 | Discharge: 2011-11-23 | Disposition: A | Discharge status: Home / Self Care | Payer: Medicare Other | Source: Ambulatory Visit | Attending: Urology | Admitting: Urology

## 2011-11-23 DIAGNOSIS — C61 Malignant neoplasm of prostate: Secondary | ICD-10-CM | POA: Insufficient documentation

## 2011-11-23 DIAGNOSIS — R972 Elevated prostate specific antigen [PSA]: Secondary | ICD-10-CM | POA: Insufficient documentation

## 2011-11-23 MED ORDER — TECHNETIUM TC 99M MEDRONATE IV KIT
25.0000 | PACK | Freq: Once | INTRAVENOUS | Status: AC | PRN
  Administered 2011-11-23: 25 via INTRAVENOUS

## 2011-12-26 ENCOUNTER — Emergency Department (HOSPITAL_COMMUNITY): Payer: Medicare Other

## 2011-12-26 ENCOUNTER — Encounter (HOSPITAL_COMMUNITY): Payer: Self-pay | Admitting: *Deleted

## 2011-12-26 ENCOUNTER — Emergency Department (HOSPITAL_COMMUNITY)
Admission: EM | Admit: 2011-12-26 | Discharge: 2011-12-27 | Disposition: A | Discharge status: Home / Self Care | Payer: Medicare Other | Attending: Emergency Medicine | Admitting: Emergency Medicine

## 2011-12-26 DIAGNOSIS — R4182 Altered mental status, unspecified: Secondary | ICD-10-CM | POA: Insufficient documentation

## 2011-12-26 DIAGNOSIS — I10 Essential (primary) hypertension: Secondary | ICD-10-CM | POA: Insufficient documentation

## 2011-12-26 DIAGNOSIS — E119 Type 2 diabetes mellitus without complications: Secondary | ICD-10-CM | POA: Insufficient documentation

## 2011-12-26 DIAGNOSIS — Z79899 Other long term (current) drug therapy: Secondary | ICD-10-CM | POA: Insufficient documentation

## 2011-12-26 DIAGNOSIS — Z794 Long term (current) use of insulin: Secondary | ICD-10-CM | POA: Insufficient documentation

## 2011-12-26 DIAGNOSIS — Z7982 Long term (current) use of aspirin: Secondary | ICD-10-CM | POA: Insufficient documentation

## 2011-12-26 DIAGNOSIS — F0390 Unspecified dementia without behavioral disturbance: Secondary | ICD-10-CM | POA: Insufficient documentation

## 2011-12-26 DIAGNOSIS — Z8546 Personal history of malignant neoplasm of prostate: Secondary | ICD-10-CM | POA: Insufficient documentation

## 2011-12-26 HISTORY — DX: Malignant (primary) neoplasm, unspecified: C80.1

## 2011-12-26 HISTORY — DX: Unspecified cirrhosis of liver: K74.60

## 2011-12-26 HISTORY — DX: Unspecified dementia, unspecified severity, without behavioral disturbance, psychotic disturbance, mood disturbance, and anxiety: F03.90

## 2011-12-26 HISTORY — DX: Essential (primary) hypertension: I10

## 2011-12-26 LAB — URINALYSIS, ROUTINE W REFLEX MICROSCOPIC
Leukocytes, UA: NEGATIVE
Nitrite: NEGATIVE
Specific Gravity, Urine: 1.018 (ref 1.005–1.030)
pH: 6.5 (ref 5.0–8.0)

## 2011-12-26 LAB — CBC
MCV: 89.7 fL (ref 78.0–100.0)
Platelets: 164 10*3/uL (ref 150–400)
RBC: 4.09 MIL/uL — ABNORMAL LOW (ref 4.22–5.81)
WBC: 6.4 10*3/uL (ref 4.0–10.5)

## 2011-12-26 LAB — POCT I-STAT, CHEM 8
HCT: 37 % — ABNORMAL LOW (ref 39.0–52.0)
Hemoglobin: 12.6 g/dL — ABNORMAL LOW (ref 13.0–17.0)
Potassium: 3.8 mEq/L (ref 3.5–5.1)
Sodium: 143 mEq/L (ref 135–145)
TCO2: 29 mmol/L (ref 0–100)

## 2011-12-26 LAB — URINE MICROSCOPIC-ADD ON

## 2011-12-26 MED ORDER — SODIUM CHLORIDE 0.9 % IV BOLUS (SEPSIS)
250.0000 mL | Freq: Once | INTRAVENOUS | Status: AC
  Administered 2011-12-26: 250 mL via INTRAVENOUS

## 2011-12-26 NOTE — ED Notes (Signed)
EMS called to home.  Found patient sitting chair. Family states that he has been more confused than normal No neuro deficits.  Patient is hard of hearing He does complain of left shoulder pain x1 week

## 2011-12-26 NOTE — ED Notes (Signed)
[id number]<AV> Expected date:<BR> Expected time:<BR> Means of arrival:<BR> Comments:<BR> Elderly male-altered LOC

## 2011-12-26 NOTE — ED Provider Notes (Signed)
History     CSN: [id number]  Arrival date & time 12/26/11  2127   First MD Initiated Contact with Patient 12/26/11 2138      Chief Complaint  Patient presents with  . Altered Mental Status    (Consider location/radiation/quality/duration/timing/severity/associated sxs/prior treatment) HPI Comments: Per EMS report patient was found sitting in a chair.  Family at home.  Reports, that he's been more confused than normal and complaining of left shoulder pain for 1 week. Patient has a history of dementia, diabetes, hypertension, and cirrhosis of the liver, per chart review  Patient is a 78 y.o. male presenting with altered mental status. The history is provided by the EMS personnel. The history is limited by the absence of a caregiver.  Altered Mental Status The current episode started today. The problem occurs constantly.    Past Medical History  Diagnosis Date  . Dementia   . Diabetes mellitus   . Hypertension   . Cancer     prostrate  . Cirrhosis     History reviewed. No pertinent past surgical history.  History reviewed. No pertinent family history.  History  Substance Use Topics  . Smoking status: Not on file  . Smokeless tobacco: Not on file  . Alcohol Use:       Review of Systems  Unable to perform ROS: Dementia  Psychiatric/Behavioral: Positive for altered mental status.    Allergies  Review of patient's allergies indicates no known allergies.  Home Medications   Current Outpatient Rx  Name Route Sig Dispense Refill  . ASPIRIN EC 81 MG PO TBEC Oral Take 81 mg by mouth daily.    . ATENOLOL 100 MG PO TABS Oral Take 100 mg by mouth daily.     Marland Kitchen BENAZEPRIL-HYDROCHLOROTHIAZIDE 20-25 MG PO TABS Oral Take 1 tablet by mouth daily.     Marland Kitchen VITAMIN D-3 1000 UNITS PO CAPS Oral Take 1 capsule by mouth daily.    Marland Kitchen JANUVIA 100 MG PO TABS Oral Take 50 mg by mouth daily.     Marland Kitchen LANTUS SOLOSTAR 100 UNIT/ML [place] SOLN Subcutaneous Inject 10-15 Units into the skin as  directed. If cbg < 120 take no lantus If cbg 120-160 take 10 units If cbg >160 take 15 units    . MAGNESIUM GLUCONATE 500 MG PO TABS Oral Take 500 mg by mouth 2 (two) times daily.    Marland Kitchen METFORMIN HCL 500 MG PO TABS Oral Take 500 mg by mouth 3 (three) times daily with meals.      BP 175/52  Temp 98.5 F (36.9 C) (Oral)  Resp 17  SpO2 100%  Physical Exam  Constitutional: He appears well-developed and well-nourished. No distress.  HENT:  Head: Normocephalic.  Eyes: Pupils are equal, round, and reactive to light.  Neck: Normal range of motion.  Cardiovascular: Normal rate.   Pulmonary/Chest: Effort normal. No respiratory distress. He has no wheezes.  Abdominal: Soft. Bowel sounds are normal. He exhibits no distension. There is no tenderness.  Musculoskeletal: He exhibits no edema and no tenderness.       Skin of feet, and lower, one third chance appeared darker in color.  No and the skin.  Temperature is consistent for upper leg to lower leg CAP refill of toes less than 3 seconds  Neurological: He is alert.  Skin: Skin is warm and dry. No rash noted. He is not diaphoretic.    ED Course  Procedures (including critical care time)  Labs Reviewed  CBC - Abnormal;  Notable for the following:    RBC 4.09 (*)     Hemoglobin 12.3 (*)     HCT 36.7 (*)     All other components within normal limits  URINALYSIS, ROUTINE W REFLEX MICROSCOPIC - Abnormal; Notable for the following:    APPearance CLOUDY (*)     Glucose, UA 250 (*)     Protein, ur 30 (*)     All other components within normal limits  POCT I-STAT, CHEM 8 - Abnormal; Notable for the following:    BUN 25 (*)     Creatinine, Ser 1.50 (*)     Hemoglobin 12.6 (*)     HCT 37.0 (*)     All other components within normal limits  URINE MICROSCOPIC-ADD ON   Dg Chest 2 View  12/26/2011  *RADIOLOGY REPORT*  Clinical Data: Altered mental status  CHEST - 2 VIEW  Comparison: 09/06/2008  Findings: Lung volumes are low. The  cardiopericardial silhouette is enlarged.  Interstitial markings are diffusely coarsened with chronic features.  There is bibasilar atelectasis or infiltrate. Imaged bony structures of the thorax are intact. Telemetry leads overlie the chest.  IMPRESSION: Low volume film with cardiomegaly and bibasilar atelectasis or infiltrate.   Original Report Authenticated By: ERIC A. MANSELL, M.D.    Ct Head Wo Contrast  12/26/2011  *RADIOLOGY REPORT*  Clinical Data: Mental status changes.  CT HEAD WITHOUT CONTRAST  Technique:  Contiguous axial images were obtained from the base of the skull through the vertex without contrast.  Comparison: None.  Findings: There is no evidence for acute hemorrhage, hydrocephalus, mass lesion, or abnormal extra-axial fluid collection.  No definite CT evidence for acute infarction.  Diffuse loss of parenchymal volume is consistent with atrophy. Patchy low attenuation in the deep hemispheric and periventricular white matter is nonspecific, but likely reflects chronic microvascular ischemic demyelination.  The visualized paranasal sinuses and mastoid air cells are clear.  IMPRESSION: No acute intracranial abnormality.  Atrophy with chronic small vessel white matter ischemic demyelination.   Original Report Authenticated By: ERIC A. MANSELL, M.D.      1. Altered mental status       MDM  Family and care give concerned because of decreased appetite and decreased ambulation tonight  Patient ambulated in hall with assist on 1        Arman Filter, NP 12/27/11 0051  Arman Filter, NP 12/27/11 [phone]

## 2011-12-29 NOTE — ED Provider Notes (Signed)
Medical screening examination/treatment/procedure(s) were performed by non-physician practitioner and as supervising physician I was immediately available for consultation/collaboration.  On my exam the patient was in no distress.  I saw the ECG (if appropriate), relevant labs and studies - I agree with the interpretation.   Gerhard Munch, MD 12/29/11 [phone]

## 2012-07-09 ENCOUNTER — Emergency Department (HOSPITAL_COMMUNITY): Payer: Medicare Other

## 2012-07-09 ENCOUNTER — Emergency Department (HOSPITAL_COMMUNITY)
Admission: EM | Admit: 2012-07-09 | Discharge: 2012-07-09 | Disposition: A | Discharge status: Home / Self Care | Payer: Medicare Other | Attending: Emergency Medicine | Admitting: Emergency Medicine

## 2012-07-09 ENCOUNTER — Encounter (HOSPITAL_COMMUNITY): Payer: Self-pay | Admitting: Emergency Medicine

## 2012-07-09 DIAGNOSIS — Z8719 Personal history of other diseases of the digestive system: Secondary | ICD-10-CM | POA: Insufficient documentation

## 2012-07-09 DIAGNOSIS — S20219A Contusion of unspecified front wall of thorax, initial encounter: Secondary | ICD-10-CM | POA: Insufficient documentation

## 2012-07-09 DIAGNOSIS — Y929 Unspecified place or not applicable: Secondary | ICD-10-CM | POA: Insufficient documentation

## 2012-07-09 DIAGNOSIS — W1809XA Striking against other object with subsequent fall, initial encounter: Secondary | ICD-10-CM | POA: Insufficient documentation

## 2012-07-09 DIAGNOSIS — F0390 Unspecified dementia without behavioral disturbance: Secondary | ICD-10-CM | POA: Insufficient documentation

## 2012-07-09 DIAGNOSIS — R05 Cough: Secondary | ICD-10-CM | POA: Insufficient documentation

## 2012-07-09 DIAGNOSIS — Z7982 Long term (current) use of aspirin: Secondary | ICD-10-CM | POA: Insufficient documentation

## 2012-07-09 DIAGNOSIS — W010XXA Fall on same level from slipping, tripping and stumbling without subsequent striking against object, initial encounter: Secondary | ICD-10-CM | POA: Insufficient documentation

## 2012-07-09 DIAGNOSIS — Z8546 Personal history of malignant neoplasm of prostate: Secondary | ICD-10-CM | POA: Insufficient documentation

## 2012-07-09 DIAGNOSIS — T148XXA Other injury of unspecified body region, initial encounter: Secondary | ICD-10-CM

## 2012-07-09 DIAGNOSIS — I10 Essential (primary) hypertension: Secondary | ICD-10-CM | POA: Insufficient documentation

## 2012-07-09 DIAGNOSIS — E1169 Type 2 diabetes mellitus with other specified complication: Secondary | ICD-10-CM | POA: Insufficient documentation

## 2012-07-09 DIAGNOSIS — R059 Cough, unspecified: Secondary | ICD-10-CM | POA: Insufficient documentation

## 2012-07-09 DIAGNOSIS — Z79899 Other long term (current) drug therapy: Secondary | ICD-10-CM | POA: Insufficient documentation

## 2012-07-09 DIAGNOSIS — Y939 Activity, unspecified: Secondary | ICD-10-CM | POA: Insufficient documentation

## 2012-07-09 LAB — GLUCOSE, CAPILLARY

## 2012-07-09 NOTE — ED Notes (Signed)
Pt c/o fall with left side pain x 3 days ago; per family pt lives at home with 24 hour care and was outside and fell against side of car; no LOC

## 2012-07-09 NOTE — ED Provider Notes (Addendum)
History     CSN: [id number]  Arrival date & time 07/09/12  1246   First MD Initiated Contact with Patient 07/09/12 1740      Chief Complaint  Patient presents with  . Fall  level 5 caveat dementia.history is obtained from patient's caregiver Ms. Williams  (Consider location/radiation/quality/duration/timing/severity/associated sxs/prior treatment) HPI Patient slipped and fell one week ago striking his left ribs against a car . He complains of left rib pain since the event pain is worse with changing positions. Improved with remaining still he presently denies pain anywhere though he complained of pain while in the waiting room. Patient denies shortness of breath this was reports he's had a cough for several days, nonproductive. No treatment prior to coming here. Patient denies abdominal pain he's been eating well Past Medical History  Diagnosis Date  . Dementia   . Diabetes mellitus   . Hypertension   . Cancer     prostrate  . Cirrhosis     History reviewed. No pertinent past surgical history.  History reviewed. No pertinent family history.  History  Substance Use Topics  . Smoking status: Not on file  . Smokeless tobacco: Not on file  . Alcohol Use:       Review of Systems  Unable to perform ROS: Dementia  Respiratory: Positive for cough.   Cardiovascular: Positive for chest pain.       Rib pain    Allergies  Review of patient's allergies indicates no known allergies.  Home Medications   Current Outpatient Rx  Name  Route  Sig  Dispense  Refill  . aspirin EC 81 MG tablet   Oral   Take 81 mg by mouth daily.         Marland Kitchen atenolol (TENORMIN) 100 MG tablet   Oral   Take 100 mg by mouth daily.          . benazepril-hydrochlorthiazide (LOTENSIN HCT) 20-25 MG per tablet   Oral   Take 1 tablet by mouth daily.          . Cholecalciferol (VITAMIN D-3) 1000 UNITS CAPS   Oral   Take 1 capsule by mouth daily.         Marland Kitchen JANUVIA 100 MG tablet   Oral  Take 50 mg by mouth daily.          Marland Kitchen LANTUS SOLOSTAR 100 UNIT/ML injection   Subcutaneous   Inject 10-15 Units into the skin as directed. If cbg < 120 take no lantus If cbg 120-160 take 10 units If cbg >160 take 15 units         . magnesium gluconate (MAGONATE) 500 MG tablet   Oral   Take 500 mg by mouth 2 (two) times daily.         . metFORMIN (GLUCOPHAGE) 500 MG tablet   Oral   Take 500 mg by mouth 3 (three) times daily with meals.           BP 179/62  Pulse 64  Temp(Src) 98.3 F (36.8 C) (Oral)  Resp 18  SpO2 99%  Physical Exam  Nursing note and vitals reviewed. Constitutional: He appears well-developed and well-nourished.  HENT:  Head: Normocephalic and atraumatic.  Eyes: Conjunctivae are normal. Pupils are equal, round, and reactive to light.  Neck: Neck supple. No tracheal deviation present. No thyromegaly present.  Cardiovascular: Normal rate and regular rhythm.   No murmur heard. Pulmonary/Chest: Effort normal and breath sounds normal.  Tender over left lateralribs midaxillary  lineno crepitus no flail  Abdominal: Soft. Bowel sounds are normal. He exhibits no distension.  Musculoskeletal: Normal range of motion. He exhibits no edema and no tenderness.  Neurological: He is alert. No cranial nerve deficit. Coordination normal.  Skin: Skin is warm and dry. No rash noted.  Psychiatric: He has a normal mood and affect.    ED Course  Procedures (including critical care time)  Labs Reviewed - No data to display Dg Ribs Unilateral W/chest Left  07/09/2012  *RADIOLOGY REPORT*  Clinical Data: Recent fall with left lower rib pain anteriorly  LEFT RIBS AND CHEST - 3+ VIEW  Comparison: Chest x-ray of 12/26/2011  Findings: There is patchy opacity at the lung bases consistent with atelectasis or possibly pneumonia.  No effusion is seen.  No pneumothorax is noted.  The heart is within upper limits of normal. There are degenerative changes throughout the thoracic spine.   Left rib detail films show no definite acute left rib fracture.  IMPRESSION:  1.  Bibasilar atelectasis.  Cannot exclude developing pneumonia. 2.  Negative left rib detail.   Original Report Authenticated By: Dwyane Dee, M.D.    X-ray viewed by me   No diagnosis found.  CBG 147 consistent with mild hyperglycemia  MDM  Doubt pneumonia, minimal cough, no fever lungs clear auscultation  Pulse oximetry normal respiratory rate Plan Tylenol for pain Followup Dr. Hessie Knows week.blood pressure recheck    Diagnosis#1 fall #2 contusion to chest wall #3 hypertension #4 hyperglycemia    Doug Sou, MD 07/09/12 4782  Doug Sou, MD 07/09/12 9562

## 2012-08-04 ENCOUNTER — Emergency Department (HOSPITAL_COMMUNITY): Payer: Medicare Other

## 2012-08-04 ENCOUNTER — Encounter (HOSPITAL_COMMUNITY): Payer: Self-pay | Admitting: *Deleted

## 2012-08-04 ENCOUNTER — Emergency Department (HOSPITAL_COMMUNITY)
Admission: EM | Admit: 2012-08-04 | Discharge: 2012-08-04 | Disposition: A | Discharge status: Home / Self Care | Payer: Medicare Other | Attending: Emergency Medicine | Admitting: Emergency Medicine

## 2012-08-04 DIAGNOSIS — Z8719 Personal history of other diseases of the digestive system: Secondary | ICD-10-CM | POA: Insufficient documentation

## 2012-08-04 DIAGNOSIS — Z7982 Long term (current) use of aspirin: Secondary | ICD-10-CM | POA: Insufficient documentation

## 2012-08-04 DIAGNOSIS — F0390 Unspecified dementia without behavioral disturbance: Secondary | ICD-10-CM | POA: Insufficient documentation

## 2012-08-04 DIAGNOSIS — R05 Cough: Secondary | ICD-10-CM | POA: Insufficient documentation

## 2012-08-04 DIAGNOSIS — E119 Type 2 diabetes mellitus without complications: Secondary | ICD-10-CM | POA: Insufficient documentation

## 2012-08-04 DIAGNOSIS — R609 Edema, unspecified: Secondary | ICD-10-CM | POA: Insufficient documentation

## 2012-08-04 DIAGNOSIS — R238 Other skin changes: Secondary | ICD-10-CM

## 2012-08-04 DIAGNOSIS — Z794 Long term (current) use of insulin: Secondary | ICD-10-CM | POA: Insufficient documentation

## 2012-08-04 DIAGNOSIS — Z8546 Personal history of malignant neoplasm of prostate: Secondary | ICD-10-CM | POA: Insufficient documentation

## 2012-08-04 DIAGNOSIS — I10 Essential (primary) hypertension: Secondary | ICD-10-CM | POA: Insufficient documentation

## 2012-08-04 DIAGNOSIS — R059 Cough, unspecified: Secondary | ICD-10-CM | POA: Insufficient documentation

## 2012-08-04 DIAGNOSIS — Z79899 Other long term (current) drug therapy: Secondary | ICD-10-CM | POA: Insufficient documentation

## 2012-08-04 DIAGNOSIS — L988 Other specified disorders of the skin and subcutaneous tissue: Secondary | ICD-10-CM | POA: Insufficient documentation

## 2012-08-04 LAB — CBC WITH DIFFERENTIAL/PLATELET
Basophils Absolute: 0 10*3/uL (ref 0.0–0.1)
Basophils Relative: 0 % (ref 0–1)
Eosinophils Absolute: 0.1 10*3/uL (ref 0.0–0.7)
Eosinophils Relative: 3 % (ref 0–5)
HCT: 33.2 % — ABNORMAL LOW (ref 39.0–52.0)
Hemoglobin: 11.6 g/dL — ABNORMAL LOW (ref 13.0–17.0)
Lymphocytes Relative: 45 % (ref 12–46)
Lymphs Abs: 2.3 10*3/uL (ref 0.7–4.0)
MCH: 30.8 pg (ref 26.0–34.0)
MCHC: 34.9 g/dL (ref 30.0–36.0)
MCV: 88.1 fL (ref 78.0–100.0)
Monocytes Absolute: 0.5 10*3/uL (ref 0.1–1.0)
Monocytes Relative: 10 % (ref 3–12)
Neutro Abs: 2.1 10*3/uL (ref 1.7–7.7)
Neutrophils Relative %: 42 % — ABNORMAL LOW (ref 43–77)
Platelets: 155 10*3/uL (ref 150–400)
RBC: 3.77 MIL/uL — ABNORMAL LOW (ref 4.22–5.81)
RDW: 14 % (ref 11.5–15.5)
WBC: 5 10*3/uL (ref 4.0–10.5)

## 2012-08-04 LAB — COMPREHENSIVE METABOLIC PANEL
ALT: 12 U/L (ref 0–53)
AST: 18 U/L (ref 0–37)
Albumin: 3.3 g/dL — ABNORMAL LOW (ref 3.5–5.2)
Alkaline Phosphatase: 110 U/L (ref 39–117)
BUN: 31 mg/dL — ABNORMAL HIGH (ref 6–23)
CO2: 30 mEq/L (ref 19–32)
Calcium: 9.1 mg/dL (ref 8.4–10.5)
Chloride: 102 mEq/L (ref 96–112)
Creatinine, Ser: 1.91 mg/dL — ABNORMAL HIGH (ref 0.50–1.35)
GFR calc Af Amer: 33 mL/min — ABNORMAL LOW (ref >90)
GFR calc non Af Amer: 28 mL/min — ABNORMAL LOW (ref >90)
Glucose, Bld: 195 mg/dL — ABNORMAL HIGH (ref 70–99)
Potassium: 4.2 mEq/L (ref 3.5–5.1)
Sodium: 139 mEq/L (ref 135–145)
Total Bilirubin: 0.2 mg/dL — ABNORMAL LOW (ref 0.3–1.2)
Total Protein: 6.7 g/dL (ref 6.0–8.3)

## 2012-08-04 LAB — GLUCOSE, CAPILLARY: Glucose-Capillary: 192 mg/dL — ABNORMAL HIGH (ref 70–99)

## 2012-08-04 MED ORDER — NEOMYCIN-POLYMYXIN-PRAMOXINE 1 % EX CREA: TOPICAL_CREAM | Freq: Once | CUTANEOUS | Status: DC

## 2012-08-04 NOTE — ED Notes (Signed)
Pt presents w/ brawny edema to bilateral feet and ankles, blisters developed recently. Pt has hx of alzheimers and dementia. Pt has hx of diabetes. Pt denies pain at this time.

## 2012-08-04 NOTE — Discharge Instructions (Signed)
Peripheral Edema You have swelling in your legs (peripheral edema). This swelling is due to excess accumulation of salt and water in your body. Edema may be a sign of heart, kidney or liver disease, or a side effect of a medication. It may also be due to problems in the leg veins. Elevating your legs and using special support stockings may be very helpful, if the cause of the swelling is due to poor venous circulation. Avoid long periods of standing, whatever the cause. Treatment of edema depends on identifying the cause. Chips, pretzels, pickles and other salty foods should be avoided. Restricting salt in your diet is almost always needed. Water pills (diuretics) are often used to remove the excess salt and water from your body via urine. These medicines prevent the kidney from reabsorbing sodium. This increases urine flow. Diuretic treatment may also result in lowering of potassium levels in your body. Potassium supplements may be needed if you have to use diuretics daily. Daily weights can help you keep track of your progress in clearing your edema. You should call your caregiver for follow up care as recommended. SEEK IMMEDIATE MEDICAL CARE IF:   You have increased swelling, pain, redness, or heat in your legs.  You develop shortness of breath, especially when lying down.  You develop chest or abdominal pain, weakness, or fainting.  You have a fever. Document Released: 04/21/2004 Document Revised: 06/06/2011 Document Reviewed: 04/01/2009 [facility] Patient Information 2013 [place], [place].  Change the dressing daily  The skin will sluff off as it dries trim as needed keep the area covered until new skin has developed Make an appointment with PCP to monitor healing

## 2012-08-04 NOTE — ED Notes (Signed)
Patient is alert and oriented x3.  He was given DC instructions and follow up visit instructions.  Patient gave verbal understanding.  He was DC ambulatory under his own power to home.  V/S stable.  He was not showing any signs of distress on DC 

## 2012-08-04 NOTE — ED Provider Notes (Signed)
History     CSN: [id number]  Arrival date & time 08/04/12  2028   First MD Initiated Contact with Patient 08/04/12 2044      Chief Complaint  Patient presents with  . Foot Swelling    (Consider location/radiation/quality/duration/timing/severity/associated sxs/prior treatment) HPI Comments: Patient with normal peripheral edema that have now formed blisters on the dorsal aspect of bilateral feet. Caregiver reports no fever, normal blood sugars and activity level, although he has had a cough for a while.  Patient is poor historian due to age and memory issues   The history is provided by a relative. The history is limited by the condition of the patient.    Past Medical History  Diagnosis Date  . Dementia   . Diabetes mellitus   . Hypertension   . Cancer     prostrate  . Cirrhosis     History reviewed. No pertinent past surgical history.  History reviewed. No pertinent family history.  History  Substance Use Topics  . Smoking status: Never Smoker   . Smokeless tobacco: Not on file  . Alcohol Use: No      Review of Systems  Constitutional: Negative for fever and chills.  Respiratory: Positive for cough. Negative for shortness of breath.   Cardiovascular: Positive for leg swelling.  Skin: Positive for wound.  All other systems reviewed and are negative.    Allergies  Review of patient's allergies indicates no known allergies.  Home Medications   Current Outpatient Rx  Name  Route  Sig  Dispense  Refill  . aspirin EC 81 MG tablet   Oral   Take 81 mg by mouth 3 (three) times a week. Monday, Wednesday and Friday evening         . atenolol (TENORMIN) 100 MG tablet   Oral   Take 100 mg by mouth daily.          . benazepril (LOTENSIN) 20 MG tablet   Oral   Take 20 mg by mouth every evening.          . benazepril-hydrochlorthiazide (LOTENSIN HCT) 20-25 MG per tablet   Oral   Take 1 tablet by mouth every evening.          . Cholecalciferol  (VITAMIN D-3) 1000 UNITS CAPS   Oral   Take 1,000 Units by mouth every evening.          Marland Kitchen ibuprofen (ADVIL,MOTRIN) 200 MG tablet   Oral   Take 400 mg by mouth daily as needed for pain.         Marland Kitchen LANTUS SOLOSTAR 100 UNIT/ML injection   Subcutaneous   Inject 10-15 Units into the skin as directed. If cbg < 120 take no lantus If cbg 120-160 take 10 units If cbg >160 take 15 units         . Magnesium 500 MG TABS   Oral   Take 500 mg by mouth 3 (three) times daily.         . metFORMIN (GLUCOPHAGE-XR) 500 MG 24 hr tablet   Oral   Take 500 mg by mouth 3 (three) times daily.         Marland Kitchen neomycin-polymyxin-pramoxine (NEOSPORIN PLUS) 1 % cream   Topical   Apply topically once. Change the dressing daily apply a small amount of the ointment to the blister sites until the skin has healed   14.2 g   0     BP 182/83  Pulse 56  Temp(Src) 98.2 F (  36.8 C) (Oral)  Resp 16  SpO2 100%  Physical Exam  Nursing note and vitals reviewed. Constitutional: He appears well-developed and well-nourished.  Eyes: Pupils are equal, round, and reactive to light.  Neck: Normal range of motion.  Cardiovascular: Normal rate and regular rhythm.   Pulmonary/Chest: Effort normal and breath sounds normal. No respiratory distress. He has no wheezes. He has no rales.  Abdominal: Soft.  Musculoskeletal: He exhibits edema. He exhibits no tenderness.  Neurological: He is alert.  Skin: Skin is warm. No erythema.  Psychiatric: His behavior is normal.    ED Course  Procedures (including critical care time)  Labs Reviewed  CBC WITH DIFFERENTIAL - Abnormal; Notable for the following:    RBC 3.77 (*)    Hemoglobin 11.6 (*)    HCT 33.2 (*)    Neutrophils Relative 42 (*)    All other components within normal limits  COMPREHENSIVE METABOLIC PANEL - Abnormal; Notable for the following:    Glucose, Bld 195 (*)    BUN 31 (*)    Creatinine, Ser 1.91 (*)    Albumin 3.3 (*)    Total Bilirubin 0.2 (*)     GFR calc non Af Amer 28 (*)    GFR calc Af Amer 33 (*)    All other components within normal limits  GLUCOSE, CAPILLARY - Abnormal; Notable for the following:    Glucose-Capillary 192 (*)    All other components within normal limits   Dg Chest 2 View  08/04/2012  *RADIOLOGY REPORT*  Clinical Data:  Cough, bilateral lower leg swelling and knee pain, hypertension, diabetes, history prostate cancer, cirrhosis  CHEST - 2 VIEW  Comparison: 07/09/2012  Findings: Enlargement of cardiac silhouette. Tortuous aorta. Pulmonary vascularity normal. Minimal bibasilar atelectasis. Lungs otherwise clear. No pleural effusion or pneumothorax. No definite focal osseous abnormalities.  IMPRESSION: Enlargement of cardiac silhouette. Bibasilar atelectasis.   Original Report Authenticated By: Ulyses Southward, M.D.      1. Peripheral edema   2. Blisters of multiple sites       MDM  Will obtain CBC and I-Stat we well as chest xray          Arman Filter, NP 08/04/12 2234

## 2012-08-08 NOTE — ED Provider Notes (Signed)
Medical screening examination/treatment/procedure(s) were conducted as a shared visit with non-physician practitioner(s) and myself.  I personally evaluated the patient during the encounter.  Pt with some blistering of LE likely from chronically rubbing legs together. No evidence of infection. Plan wound care.   Raeford Razor, MD 08/08/12 [phone]

## 2013-01-24 ENCOUNTER — Other Ambulatory Visit (HOSPITAL_COMMUNITY): Payer: Self-pay | Admitting: Urology

## 2013-01-24 DIAGNOSIS — C61 Malignant neoplasm of prostate: Secondary | ICD-10-CM

## 2013-02-01 ENCOUNTER — Encounter: Payer: Self-pay | Admitting: Internal Medicine

## 2013-02-04 ENCOUNTER — Other Ambulatory Visit: Payer: Medicare Other

## 2013-02-05 ENCOUNTER — Encounter (HOSPITAL_COMMUNITY): Payer: Medicare Other

## 2013-02-19 ENCOUNTER — Encounter (HOSPITAL_COMMUNITY)
Admission: RE | Admit: 2013-02-19 | Discharge: 2013-02-19 | Disposition: A | Discharge status: Home / Self Care | Payer: Medicare Other | Source: Ambulatory Visit | Attending: Urology | Admitting: Urology

## 2013-02-19 DIAGNOSIS — C61 Malignant neoplasm of prostate: Secondary | ICD-10-CM

## 2013-02-19 MED ORDER — TECHNETIUM TC 99M MEDRONATE IV KIT
26.0000 | PACK | Freq: Once | INTRAVENOUS | Status: AC | PRN
  Administered 2013-02-19: 26 via INTRAVENOUS

## 2013-02-20 ENCOUNTER — Ambulatory Visit (HOSPITAL_COMMUNITY): Payer: Medicare Other

## 2013-02-20 ENCOUNTER — Encounter (HOSPITAL_COMMUNITY): Payer: Medicare Other

## 2013-03-07 ENCOUNTER — Other Ambulatory Visit: Payer: Self-pay | Admitting: Internal Medicine

## 2013-03-19 ENCOUNTER — Encounter: Payer: Self-pay | Admitting: Emergency Medicine

## 2013-03-19 ENCOUNTER — Ambulatory Visit (INDEPENDENT_AMBULATORY_CARE_PROVIDER_SITE_OTHER): Payer: Medicare Other | Admitting: Emergency Medicine

## 2013-03-19 VITALS — BP 134/80 | HR 66 | Temp 98.0°F | Resp 16 | Ht 63.5 in | Wt 165.0 lb

## 2013-03-19 DIAGNOSIS — R5381 Other malaise: Secondary | ICD-10-CM

## 2013-03-19 DIAGNOSIS — N390 Urinary tract infection, site not specified: Secondary | ICD-10-CM

## 2013-03-19 DIAGNOSIS — R6889 Other general symptoms and signs: Secondary | ICD-10-CM

## 2013-03-19 DIAGNOSIS — R899 Unspecified abnormal finding in specimens from other organs, systems and tissues: Secondary | ICD-10-CM

## 2013-03-19 LAB — CBC WITH DIFFERENTIAL/PLATELET
Basophils Absolute: 0 10*3/uL (ref 0.0–0.1)
Lymphocytes Relative: 38 % (ref 12–46)
Lymphs Abs: 2 10*3/uL (ref 0.7–4.0)
Neutrophils Relative %: 48 % (ref 43–77)
Platelets: 242 10*3/uL (ref 150–400)
RBC: 4.24 MIL/uL (ref 4.22–5.81)
RDW: 14.4 % (ref 11.5–15.5)
WBC: 5.4 10*3/uL (ref 4.0–10.5)

## 2013-03-19 LAB — BASIC METABOLIC PANEL WITH GFR
BUN: 22 mg/dL (ref 6–23)
Calcium: 9.5 mg/dL (ref 8.4–10.5)
Creat: 1.76 mg/dL — ABNORMAL HIGH (ref 0.50–1.35)
GFR, Est African American: 37 mL/min — ABNORMAL LOW
Glucose, Bld: 152 mg/dL — ABNORMAL HIGH (ref 70–99)
Sodium: 142 mEq/L (ref 135–145)

## 2013-03-19 LAB — HEPATIC FUNCTION PANEL
ALT: 10 U/L (ref 0–53)
AST: 14 U/L (ref 0–37)
Albumin: 4 g/dL (ref 3.5–5.2)
Total Protein: 6.8 g/dL (ref 6.0–8.3)

## 2013-03-19 NOTE — Patient Instructions (Signed)
Urinary Tract Infection °A urinary tract infection (UTI) can occur any place along the urinary tract. The tract includes the kidneys, ureters, bladder, and urethra. A type of germ called bacteria often causes a UTI. UTIs are often helped with antibiotic medicine.  °HOME CARE  °· If given, take antibiotics as told by your doctor. Finish them even if you start to feel better. °· Drink enough fluids to keep your pee (urine) clear or pale yellow. °· Avoid tea, drinks with caffeine, and bubbly (carbonated) drinks. °· Pee often. Avoid holding your pee in for a long time. °· Pee before and after having sex (intercourse). °· Wipe from front to back after you poop (bowel movement) if you are a woman. Use each tissue only once. °GET HELP RIGHT AWAY IF:  °· You have back pain. °· You have lower belly (abdominal) pain. °· You have chills. °· You feel sick to your stomach (nauseous). °· You throw up (vomit). °· Your burning or discomfort with peeing does not go away. °· You have a fever. °· Your symptoms are not better in 3 days. °MAKE SURE YOU:  °· Understand these instructions. °· Will watch your condition. °· Will get help right away if you are not doing well or get worse. °Document Released: 08/31/2007 Document Revised: 12/07/2011 Document Reviewed: 10/13/2011 °[employer]® Patient Information ©2014 [employer]. ° °

## 2013-03-20 NOTE — Progress Notes (Signed)
Subjective:    Patient ID: [name], male    DOB: January 28, 1915, 79 y.o.   MRN: [id number]  HPI Comments: 79 yo male presents with care givers from nursing home for recheck of feet and labs. [name] had recent UTI with treatment, staff denies any urine problems.   He also had abnormal lab at last office visit. Staff notes he occasional seems fatigued but denies any unusual behaviors. His BP and BS have been fluctuating per nursing notes.  [name] has recent history of ulcers on bilateral feet which have healed. His staff notes he has not had any new ulcers appear. Staff denies any extra lotion applications or wraps. He is not involved any regular exercise or PT at nursing facility.   Current Outpatient Prescriptions on File Prior to Visit  Medication Sig Dispense Refill  . aspirin EC 81 MG tablet Take 81 mg by mouth 3 (three) times a week. Monday, Wednesday and Friday evening      . atenolol (TENORMIN) 100 MG tablet Take 100 mg by mouth daily.       . benazepril (LOTENSIN) 20 MG tablet Take 20 mg by mouth every evening.       . Cholecalciferol (VITAMIN D-3) 1000 UNITS CAPS Take 1,000 Units by mouth every evening.       Marland Kitchen LANTUS SOLOSTAR 100 UNIT/ML injection Inject 10-15 Units into the skin as directed. If cbg < 120 take no lantus If cbg 120-160 take 10 units If cbg >160 take 15 units      . Magnesium 500 MG TABS Take 500 mg by mouth 3 (three) times daily.      . metFORMIN (GLUCOPHAGE-XR) 500 MG 24 hr tablet TAKE 1-2 TABLETS BY MOUTH 2 TIMES DAILY AFTER MEALS AS DIRECTED  360 tablet  3  . benazepril-hydrochlorthiazide (LOTENSIN HCT) 20-25 MG per tablet Take 1 tablet by mouth every evening.       Marland Kitchen ibuprofen (ADVIL,MOTRIN) 200 MG tablet Take 400 mg by mouth daily as needed for pain.      Marland Kitchen neomycin-polymyxin-pramoxine (NEOSPORIN PLUS) 1 % cream Apply topically once. Change the dressing daily apply a small amount of the ointment to the blister sites until the skin has healed  14.2 g  0    No current facility-administered medications on file prior to visit.   ALLERGIES Allegra  Past Medical History  Diagnosis Date  . Dementia   . Diabetes mellitus   . Hypertension   . Cancer     prostrate  . Cirrhosis   . GERD (gastroesophageal reflux disease)       Review of Systems  Constitutional: Positive for fatigue.   BP 134/80  Pulse 66  Temp(Src) 98 F (36.7 C) (Temporal)  Resp 16  Ht 5' 3.5" (1.613 m)  Wt 165 lb (74.844 kg)  BMI 28.77 kg/m2     Objective:   Physical Exam  Nursing note and vitals reviewed. Constitutional: He is oriented to person, place, and time. He appears well-developed and well-nourished.  HENT:  Head: Normocephalic and atraumatic.  Right Ear: External ear normal.  Left Ear: External ear normal.  Nose: Nose normal.  Eyes: Conjunctivae and EOM are normal.  Neck: Normal range of motion. Neck supple. No JVD present. No thyromegaly present.  Cardiovascular: Normal rate, regular rhythm, normal heart sounds and intact distal pulses.   Pulmonary/Chest: Effort normal and breath sounds normal.  Abdominal: Soft. Bowel sounds are normal. He exhibits no distension and no mass. There is  no tenderness. There is no rebound and no guarding.  Musculoskeletal: Normal range of motion. He exhibits no edema and no tenderness.  Lymphadenopathy:    He has no cervical adenopathy.  Neurological: He is alert and oriented to person, place, and time. He has normal reflexes. No cranial nerve deficit. Coordination normal.  Skin: Skin is warm and dry.  Both feet with pink skin at sites of old ulcerations. NO current breakdown. Skin very DRY.  Psychiatric: He has a normal mood and affect. His behavior is normal. Judgment and thought content normal.          Assessment & Plan:  1. Recent UTI needs recheck 2. Fatigue with history of abnormal labs- check labs, increase activity and H2O 3. Recent Ulcers on both feet, healed currently with dry skin- Advised  Vaseline to skin with clean white socks QHS

## 2013-03-28 ENCOUNTER — Emergency Department (EMERGENCY_DEPARTMENT_HOSPITAL)
Admission: EM | Admit: 2013-03-28 | Discharge: 2013-03-28 | Discharge status: Against Medical Advice | Payer: Medicare Other | Source: Home / Self Care | Attending: Emergency Medicine | Admitting: Emergency Medicine

## 2013-03-28 ENCOUNTER — Encounter (HOSPITAL_COMMUNITY): Payer: Self-pay | Admitting: Emergency Medicine

## 2013-03-28 ENCOUNTER — Emergency Department (HOSPITAL_COMMUNITY)
Admission: EM | Admit: 2013-03-28 | Discharge: 2013-03-28 | Disposition: A | Discharge status: Home / Self Care | Payer: Medicare Other | Attending: Emergency Medicine | Admitting: Emergency Medicine

## 2013-03-28 ENCOUNTER — Emergency Department (HOSPITAL_COMMUNITY): Payer: Medicare Other

## 2013-03-28 DIAGNOSIS — R29810 Facial weakness: Secondary | ICD-10-CM

## 2013-03-28 DIAGNOSIS — R1110 Vomiting, unspecified: Secondary | ICD-10-CM | POA: Insufficient documentation

## 2013-03-28 DIAGNOSIS — E1169 Type 2 diabetes mellitus with other specified complication: Secondary | ICD-10-CM

## 2013-03-28 DIAGNOSIS — R5383 Other fatigue: Secondary | ICD-10-CM

## 2013-03-28 DIAGNOSIS — Z794 Long term (current) use of insulin: Secondary | ICD-10-CM | POA: Insufficient documentation

## 2013-03-28 DIAGNOSIS — R5381 Other malaise: Secondary | ICD-10-CM | POA: Insufficient documentation

## 2013-03-28 DIAGNOSIS — Z87891 Personal history of nicotine dependence: Secondary | ICD-10-CM | POA: Insufficient documentation

## 2013-03-28 DIAGNOSIS — R52 Pain, unspecified: Secondary | ICD-10-CM | POA: Insufficient documentation

## 2013-03-28 DIAGNOSIS — E119 Type 2 diabetes mellitus without complications: Secondary | ICD-10-CM | POA: Insufficient documentation

## 2013-03-28 DIAGNOSIS — R4182 Altered mental status, unspecified: Secondary | ICD-10-CM

## 2013-03-28 DIAGNOSIS — I10 Essential (primary) hypertension: Secondary | ICD-10-CM | POA: Insufficient documentation

## 2013-03-28 DIAGNOSIS — R011 Cardiac murmur, unspecified: Secondary | ICD-10-CM | POA: Insufficient documentation

## 2013-03-28 DIAGNOSIS — K219 Gastro-esophageal reflux disease without esophagitis: Secondary | ICD-10-CM | POA: Insufficient documentation

## 2013-03-28 DIAGNOSIS — F0390 Unspecified dementia without behavioral disturbance: Secondary | ICD-10-CM | POA: Insufficient documentation

## 2013-03-28 DIAGNOSIS — Z8546 Personal history of malignant neoplasm of prostate: Secondary | ICD-10-CM | POA: Insufficient documentation

## 2013-03-28 DIAGNOSIS — R609 Edema, unspecified: Secondary | ICD-10-CM | POA: Insufficient documentation

## 2013-03-28 DIAGNOSIS — Z79899 Other long term (current) drug therapy: Secondary | ICD-10-CM | POA: Insufficient documentation

## 2013-03-28 DIAGNOSIS — E162 Hypoglycemia, unspecified: Secondary | ICD-10-CM

## 2013-03-28 DIAGNOSIS — R531 Weakness: Secondary | ICD-10-CM

## 2013-03-28 DIAGNOSIS — R6889 Other general symptoms and signs: Secondary | ICD-10-CM | POA: Insufficient documentation

## 2013-03-28 LAB — URINALYSIS, ROUTINE W REFLEX MICROSCOPIC
Bilirubin Urine: NEGATIVE
Glucose, UA: 100 mg/dL — AB
Ketones, ur: 15 mg/dL — AB
LEUKOCYTES UA: NEGATIVE
NITRITE: NEGATIVE
PH: 7 (ref 5.0–8.0)
Protein, ur: 100 mg/dL — AB
SPECIFIC GRAVITY, URINE: 1.021 (ref 1.005–1.030)
Urobilinogen, UA: 0.2 mg/dL (ref 0.0–1.0)

## 2013-03-28 LAB — POCT I-STAT, CHEM 8
BUN: 20 mg/dL (ref 6–23)
Calcium, Ion: 1.19 mmol/L (ref 1.13–1.30)
Chloride: 102 mEq/L (ref 96–112)
Creatinine, Ser: 1.5 mg/dL — ABNORMAL HIGH (ref 0.50–1.35)
Glucose, Bld: 146 mg/dL — ABNORMAL HIGH (ref 70–99)
HEMATOCRIT: 36 % — AB (ref 39.0–52.0)
HEMOGLOBIN: 12.2 g/dL — AB (ref 13.0–17.0)
POTASSIUM: 3.6 meq/L — AB (ref 3.7–5.3)
SODIUM: 145 meq/L (ref 137–147)
TCO2: 30 mmol/L (ref 0–100)

## 2013-03-28 LAB — COMPREHENSIVE METABOLIC PANEL
ALT: 16 U/L (ref 0–53)
AST: 22 U/L (ref 0–37)
Albumin: 3.4 g/dL — ABNORMAL LOW (ref 3.5–5.2)
Alkaline Phosphatase: 89 U/L (ref 39–117)
BUN: 21 mg/dL (ref 6–23)
CALCIUM: 9.1 mg/dL (ref 8.4–10.5)
CO2: 28 mEq/L (ref 19–32)
Chloride: 102 mEq/L (ref 96–112)
Creatinine, Ser: 1.36 mg/dL — ABNORMAL HIGH (ref 0.50–1.35)
GFR calc non Af Amer: 42 mL/min — ABNORMAL LOW (ref >90)
GFR, EST AFRICAN AMERICAN: 49 mL/min — AB (ref >90)
GLUCOSE: 138 mg/dL — AB (ref 70–99)
Potassium: 3.8 mEq/L (ref 3.7–5.3)
SODIUM: 144 meq/L (ref 137–147)
TOTAL PROTEIN: 6.9 g/dL (ref 6.0–8.3)
Total Bilirubin: 0.3 mg/dL (ref 0.3–1.2)

## 2013-03-28 LAB — CBC WITH DIFFERENTIAL/PLATELET
Basophils Absolute: 0 10*3/uL (ref 0.0–0.1)
Basophils Relative: 0 % (ref 0–1)
EOS ABS: 0.1 10*3/uL (ref 0.0–0.7)
EOS PCT: 1 % (ref 0–5)
HCT: 35.6 % — ABNORMAL LOW (ref 39.0–52.0)
Hemoglobin: 11.6 g/dL — ABNORMAL LOW (ref 13.0–17.0)
LYMPHS ABS: 2.2 10*3/uL (ref 0.7–4.0)
Lymphocytes Relative: 31 % (ref 12–46)
MCH: 29.4 pg (ref 26.0–34.0)
MCHC: 32.6 g/dL (ref 30.0–36.0)
MCV: 90.1 fL (ref 78.0–100.0)
Monocytes Absolute: 0.8 10*3/uL (ref 0.1–1.0)
Monocytes Relative: 11 % (ref 3–12)
Neutro Abs: 4 10*3/uL (ref 1.7–7.7)
Neutrophils Relative %: 57 % (ref 43–77)
PLATELETS: 173 10*3/uL (ref 150–400)
RBC: 3.95 MIL/uL — AB (ref 4.22–5.81)
RDW: 13.5 % (ref 11.5–15.5)
WBC: 7 10*3/uL (ref 4.0–10.5)

## 2013-03-28 LAB — POCT I-STAT TROPONIN I: Troponin i, poc: 0.07 ng/mL (ref 0.00–0.08)

## 2013-03-28 LAB — URINE MICROSCOPIC-ADD ON

## 2013-03-28 LAB — GLUCOSE, CAPILLARY: Glucose-Capillary: 79 mg/dL (ref 70–99)

## 2013-03-28 LAB — LIPASE, BLOOD: Lipase: 20 U/L (ref 11–59)

## 2013-03-28 LAB — CG4 I-STAT (LACTIC ACID): LACTIC ACID, VENOUS: 3.26 mmol/L — AB (ref 0.5–2.2)

## 2013-03-28 MED ORDER — METOCLOPRAMIDE HCL 10 MG PO TABS: 10.0000 mg | ORAL_TABLET | Freq: Four times a day (QID) | ORAL | Status: DC | PRN

## 2013-03-28 MED ORDER — SODIUM CHLORIDE 0.9 % IV BOLUS (SEPSIS)
1000.0000 mL | Freq: Once | INTRAVENOUS | Status: AC
  Administered 2013-03-28: 1000 mL via INTRAVENOUS

## 2013-03-28 MED ORDER — ONDANSETRON 8 MG PO TBDP: ORAL_TABLET | ORAL | Status: DC

## 2013-03-28 MED ORDER — DEXTROSE 50 % IV SOLN
50.0000 mL | Freq: Once | INTRAVENOUS | Status: DC
  Filled 2013-03-28: qty 50

## 2013-03-28 MED ORDER — ASPIRIN EC 81 MG PO TBEC
81.0000 mg | DELAYED_RELEASE_TABLET | Freq: Every day | ORAL | Status: DC
  Filled 2013-03-28: qty 1

## 2013-03-28 NOTE — Discharge Instructions (Signed)
Your caregiver has seen you today because you are having problems with feelings of weakness, dizziness, and/or fatigue. Weakness has many different causes, some of which are common and others are very rare. Your caregiver has considered some of the most common causes of weakness and feels it is safe for you to go home and be observed. Not every illness or injury can be identified during an emergency department visit, thus follow-up with your primary healthcare provider is important. Medical conditions can also worsen, so it is also important to return immediately as directed below, or if you have other serious concerns develop. RETURN IMMEDIATELY IF you develop new shortness of breath, chest pain, fever, have difficulty moving parts of your body (new weakness, numbness, or incoordination), sudden change in speech, vision, swallowing, or understanding, faint or develop new dizziness, severe headache, become poorly responsive or have an altered mental status compared to baseline for you, new rash, abdominal pain, or bloody stools,  Return sooner also if you develop new problems for which you have not talked to your caregiver but you feel may be emergency medical conditions, or are unable to be cared for safely at home.   SEEK IMMEDIATE MEDICAL ATTENTION IF:  A temperature above 101 develops.  Repeated vomiting occurs (multiple episodes).  You develop abdominal pain.  Blood is being passed in stools or vomit (bright red or black tarry stools).  Return also if you develop chest pain, difficulty breathing, dizziness or fainting, or become confused, poorly responsive, or inconsolable (young children).

## 2013-03-28 NOTE — ED Notes (Signed)
PTAR made aware.

## 2013-03-28 NOTE — ED Provider Notes (Signed)
Pt just left ED with PTAR; Pt left awake and alert, hard of hearing, baseline mental status with dementia, PTAR checked blood glucose noted to be 65; Pt asymptomatic but PTAR cannot transport unless glucose given so Pt brought right back into ED by PTAR.  Pt awake and alert, follows simple commands, hard of hearing, pleasantly confused.  Refer to my H&P from today's ED visit.  Pt eating in ED, given D50.  Babette Relic, MD 03/29/13 [phone]

## 2013-03-28 NOTE — ED Notes (Addendum)
Pt was being discharged from ED and going with PTAR. CBG was 65. Pt was given orange juice and CBG dropped to 56.  Currently getting pt IV to give dextrose. No cardiac or respiratory distress. Will continue to monitor.

## 2013-03-28 NOTE — ED Notes (Signed)
CBG 79 

## 2013-03-28 NOTE — ED Notes (Signed)
Pt discharged via PT[facility] back to nursing home.  PTAR brought pt back to ED ref. CBG of 65.  Dr. Stevie Kern made aware and saw pt.   Verbal order for orange juice with sugar obtained.  Pt drank oj and now will repeat CBG.

## 2013-03-28 NOTE — ED Notes (Signed)
NOTIFIED DR. Stevie Kern IN PERSON OF PATIENTS LAB RESULTS OF CG4 LACTIC ACID = 3.26 mmoI/L ,@12 ;20 PM ,03/28/2013.

## 2013-03-28 NOTE — ED Notes (Signed)
Vomiting, nausea, body aches since yesterday from NH. Given Zofran 4mg  IV and NItroglycerin X 1 for chest pain by EMS. - diarrhea.

## 2013-03-28 NOTE — ED Notes (Signed)
2 Rns attempted IV. Unable to get. Currently feeding pt food and drink. Will recheck after completion.

## 2013-03-28 NOTE — ED Notes (Signed)
Plan of care discussed with POA at bedside. Clothes placed back on patient. PTAR called by Network engineer.

## 2013-03-28 NOTE — ED Notes (Signed)
MD at bedside. 

## 2013-03-28 NOTE — ED Notes (Signed)
Attempted to call [name] but no answer from med tech to give report.

## 2013-03-28 NOTE — ED Notes (Signed)
Dr. Stevie Kern aware of blood glucose. Okayed for pt to be discharged.

## 2013-03-28 NOTE — Consult Note (Signed)
Referring Physician: ED    Chief Complaint: RIGHT SIDED WEAKNESS, RIGHT FACE WEAKNESS. MENTAL STATE CHANGES.  HPI:                                                                                                                                         [name] is an 80 y.o. male with a past medical history significant for DM, HTN, stroke, dementia, prostate cancer, cirrhosis, brought to [facility] ED as a code stroke due to the above stated symptoms. She lives at an assisted living facility and was reportedly last seen normal at 455 pm today when he was getting ready for dinner and he was noted to be less responsive and weak in the right side. EMS said that when they arrived he was weak all over, responsive, but with right face weakness. Initial NIHSS 4. CT brain showed no acute abnormality. Denies HA, vertigo, double vision, difficulty swallowing, slurred speech, language or vision impairment.  Date last known well: 03/28/13  Time last known well: 455pm tPA Given: no, stroke 2 weeks ago, NIHSS 4.  NIHSS: 4   Past Medical History  Diagnosis Date  . Dementia   . Diabetes mellitus   . Hypertension   . Cancer     prostrate  . Cirrhosis   . GERD (gastroesophageal reflux disease)     Past Surgical History  Procedure Laterality Date  . Appendectomy      Family History  Problem Relation Age of Onset  . Diabetes Mother    Social History:  reports that he quit smoking about 42 years ago. He does not have any smokeless tobacco history on file. He reports that he does not drink alcohol or use illicit drugs.  Allergies:  Allergies  Allergen Reactions  . Allegra [Fexofenadine Hcl] Other (See Comments)    LBP    Medications:                                                                                                                           I have reviewed the patient's current medications.  ROS: UNABLE TO OBTAIN DUE TO MENTAL STATUS.  History obtained from chart review   Physical exam: pleasant male in no apparent distress. Head: normocephalic. Neck: supple, no bruits, no JVD. Cardiac: no murmurs. Lungs: clear. Abdomen: soft, no tender, no mass. Extremities: no edema.  Neurologic Examination:                                                                                                      Mental Status: Alert, oriented to place.  Speech fluent without evidence of aphasia.  Able to follow 3 step commands without difficulty. Cranial Nerves: II: Discs flat bilaterally; Visual fields grossly normal, pupils equal, round, reactive to light and accommodation III,IV, VI: ptosis not present, extra-ocular motions intact bilaterally V,VII: smile symmetric, facial light touch sensation normal bilaterally VIII: hearing normal bilaterally IX,X: gag reflex present XI: bilateral shoulder shrug XII: midline tongue extension without atrophy or fasciculations  Motor: Right : Upper extremity   5/5    Left:     Upper extremity   5/5  Lower extremity   5/5     Lower extremity   5/5 Tone and bulk:normal tone throughout; no atrophy noted Sensory: Pinprick and light touch intact throughout, bilaterally Deep Tendon Reflexes:  Right: Upper Extremity   Left: Upper extremity   biceps (C-5 to C-6) 2/4   biceps (C-5 to C-6) 2/4 tricep (C7) 2/4    triceps (C7) 2/4 Brachioradialis (C6) 2/4  Brachioradialis (C6) 2/4  Lower Extremity Lower Extremity  quadriceps (L-2 to L-4) 2/4   quadriceps (L-2 to L-4) 2/4 Achilles (S1) 2/4   Achilles (S1) 2/4  Plantars: Right: downgoing   Left: downgoing Cerebellar: normal finger-to-nose,  normal heel-to-shin test Gait:  Unable to test. CV: pulses palpable throughout    Results for orders placed during the hospital encounter of 03/28/13 (from the past 48 hour(s))  GLUCOSE, CAPILLARY     Status: None   Collection Time    03/28/13  5:13 PM       Result Value Range   Glucose-Capillary 79  70 - 99 mg/dL   Dg Chest 2 View  03/28/2013   CLINICAL DATA:  Emesis.  Vomiting, nausea.  Body aches.  EXAM: CHEST  2 VIEW  COMPARISON:  08/04/2012  FINDINGS: The heart size is upper normal. Mediastinal and hilar contours are normal. There is slight prominence of the pulmonary vascularity. Lung volumes are low, particularly on the lateral view. There is some prominence of the peribronchial markings the lung bases, and this appears similar to prior studies. No consolidation or pleural effusion. No acute osseous abnormality. Typical degenerative changes of the thoracic spine noted.  IMPRESSION: Mild peribronchial thickening at the lung bases appears chronic. No definite acute cardiopulmonary disease.  Mild cardiomegaly.   Electronically Signed   By: Curlene Dolphin M.D.   On: 03/28/2013 12:50     [facility] [phone]  03/28/2013, 6:58 PM   Assessment: 80 y.o. male with transient altered mental status, right sided weakness, right face weakness. NIHSS 4 but improved in the ED. CT brain unremarkable for acute abnormality. Left brain stroke versus TIA. Recommend: admit to  medicine, MRI brain. Continue aspirin. Will follow up.  Stroke Risk Factors - age, DM, HTN, stroke,  Plan: 1. HgbA1c, fasting lipid panel 2. MRI, MRA  of the brain without contrast 3. Echocardiogram 4. Carotid dopplers 5. Prophylactic therapy-Antiplatelet med: Aspirin 81 mg 6. Risk factor modification 7. Telemetry monitoring 8. Frequent neuro checks 9. PT/OT SLP   Dorian Pod, MD [facility] [phone]  03/28/2013, 6:58 PM

## 2013-03-28 NOTE — ED Notes (Signed)
Pt went with [facility]. All paperwork and clothing given to [facility].

## 2013-03-28 NOTE — ED Provider Notes (Signed)
CSN: [id number]     Arrival date & time 03/28/13  19 History   First MD Initiated Contact with Patient 03/28/13 1133     Chief Complaint  Patient presents with  . Emesis   (Consider location/radiation/quality/duration/timing/severity/associated sxs/prior Treatment) HPI Demented and generally weak at baseline but apparently able to walk unassisted at baseline, overnight developed several episodes of nonbloody vomiting with generalized body aches generalized weakness and cannot walk without assistance today denies abdominal pain denies cough or shortness of breath apparently had some vague transient chest pain prior to arrival during transport only which is now resolved; no vomiting in ED; received Zofran PTA from EMS  Lungs decreased breath sounds bilaterally pulse ox normal room air 95% abdomen is nontender soft systolic murmur trace edema Past Medical History  Diagnosis Date  . Dementia   . Diabetes mellitus   . Hypertension   . Cancer     prostrate  . Cirrhosis   . GERD (gastroesophageal reflux disease)    Past Surgical History  Procedure Laterality Date  . Appendectomy     Family History  Problem Relation Age of Onset  . Diabetes Mother    History  Substance Use Topics  . Smoking status: Former Smoker    Quit date: 03/29/1971  . Smokeless tobacco: Not on file  . Alcohol Use: No    Review of Systems  Unable to perform ROS: Dementia  Endocrine: Positive for heat intolerance.    Allergies  Allegra  Home Medications   Current Outpatient Rx  Name  Route  Sig  Dispense  Refill  . aspirin EC 81 MG tablet   Oral   Take 81 mg by mouth 3 (three) times a week. Monday, Wednesday and Friday evening         . atenolol (TENORMIN) 100 MG tablet   Oral   Take 100 mg by mouth daily.          . B Complex Vitamins (VITAMIN B COMPLEX) TABS   Oral   Take by mouth daily.         . benazepril (LOTENSIN) 20 MG tablet   Oral   Take 20 mg by mouth every evening.         . benazepril-hydrochlorthiazide (LOTENSIN HCT) 20-25 MG per tablet   Oral   Take 1 tablet by mouth every evening.          . Cholecalciferol (VITAMIN D-3) 1000 UNITS CAPS   Oral   Take 1,000 Units by mouth every evening.          . gabapentin (NEURONTIN) 300 MG capsule   Oral   Take 300 mg by mouth at bedtime.         Marland Kitchen ibuprofen (ADVIL,MOTRIN) 200 MG tablet   Oral   Take 400 mg by mouth daily as needed for pain.         Marland Kitchen LANTUS SOLOSTAR 100 UNIT/ML injection   Subcutaneous   Inject 10-15 Units into the skin as directed. If cbg < 120 take no lantus If cbg 120-160 take 10 units If cbg >160 take 15 units         . Magnesium 500 MG TABS   Oral   Take 500 mg by mouth 3 (three) times daily.         . metFORMIN (GLUCOPHAGE-XR) 500 MG 24 hr tablet      TAKE 1-2 TABLETS BY MOUTH 2 TIMES DAILY AFTER MEALS AS DIRECTED   360 tablet  3   . metoCLOPramide (REGLAN) 10 MG tablet   Oral   Take 1 tablet (10 mg total) by mouth every 6 (six) hours as needed for nausea (nausea/headache).   6 tablet   0   . neomycin-polymyxin-pramoxine (NEOSPORIN PLUS) 1 % cream   Topical   Apply topically once. Change the dressing daily apply a small amount of the ointment to the blister sites until the skin has healed   14.2 g   0   . ondansetron (ZOFRAN ODT) 8 MG disintegrating tablet      8mg  ODT q4 hours prn nausea   4 tablet   0    BP 167/57  Pulse 57  Temp(Src) 98.4 F (36.9 C) (Oral)  Resp 12  SpO2 95% Physical Exam  Nursing note and vitals reviewed. Constitutional:  Awake, alert, nontoxic appearance.  HENT:  Head: Atraumatic.  Eyes: Right eye exhibits no discharge. Left eye exhibits no discharge.  Neck: Neck supple.  Cardiovascular: Normal rate and regular rhythm.   Murmur heard. Soft systolic murmur  Pulmonary/Chest: Effort normal. No respiratory distress. He has no wheezes. He has no rales. He exhibits no tenderness.  Decreased breath sounds bilat normal  pulse ox room air 95%  Abdominal: Soft. There is no tenderness. There is no rebound.  Musculoskeletal: He exhibits edema. He exhibits no tenderness.  Baseline ROM, no obvious new focal weakness.Trace edema.  Neurological: He is alert.  Mental status and motor strength appears baseline for patient and situation.  Skin: No rash noted.  Psychiatric: He has a normal mood and affect.    ED Course  Procedures (including critical care time) Awake and alert; hard of hearing. Moves all extremities well.Pt stable in ED with no significant deterioration in condition.Patient / Family / Caregiver informed of clinical course, understand medical decision-making process, and agree with plan. No vomiting in ED. Labs Review Labs Reviewed  CBC WITH DIFFERENTIAL - Abnormal; Notable for the following:    RBC 3.95 (*)    Hemoglobin 11.6 (*)    HCT 35.6 (*)    All other components within normal limits  COMPREHENSIVE METABOLIC PANEL - Abnormal; Notable for the following:    Glucose, Bld 138 (*)    Creatinine, Ser 1.36 (*)    Albumin 3.4 (*)    GFR calc non Af Amer 42 (*)    GFR calc Af Amer 49 (*)    All other components within normal limits  URINALYSIS, ROUTINE W REFLEX MICROSCOPIC - Abnormal; Notable for the following:    Glucose, UA 100 (*)    Hgb urine dipstick TRACE (*)    Ketones, ur 15 (*)    Protein, ur 100 (*)    All other components within normal limits  POCT I-STAT, CHEM 8 - Abnormal; Notable for the following:    Potassium 3.6 (*)    Creatinine, Ser 1.50 (*)    Glucose, Bld 146 (*)    Hemoglobin 12.2 (*)    HCT 36.0 (*)    All other components within normal limits  CG4 I-STAT (LACTIC ACID) - Abnormal; Notable for the following:    Lactic Acid, Venous 3.26 (*)    All other components within normal limits  LIPASE, BLOOD  URINE MICROSCOPIC-ADD ON  POCT I-STAT TROPONIN I   Imaging Review Dg Chest 2 View  03/28/2013   CLINICAL DATA:  Emesis.  Vomiting, nausea.  Body aches.  EXAM: CHEST   2 VIEW  COMPARISON:  08/04/2012  FINDINGS: The heart size  is upper normal. Mediastinal and hilar contours are normal. There is slight prominence of the pulmonary vascularity. Lung volumes are low, particularly on the lateral view. There is some prominence of the peribronchial markings the lung bases, and this appears similar to prior studies. No consolidation or pleural effusion. No acute osseous abnormality. Typical degenerative changes of the thoracic spine noted.  IMPRESSION: Mild peribronchial thickening at the lung bases appears chronic. No definite acute cardiopulmonary disease.  Mild cardiomegaly.   Electronically Signed   By: Curlene Dolphin M.D.   On: 03/28/2013 12:50    EKG Interpretation    Date/Time:  Thursday March 28 2013 12:06:51 EST Ventricular Rate:  57 PR Interval:  224 QRS Duration: 78 QT Interval:  456 QTC Calculation: 444 R Axis:   -16 Text Interpretation:  Sinus rhythm Prolonged PR interval Borderline left axis deviation Baseline wander in lead(s) I III aVL V3 No significant change since last tracing Confirmed by [facility]  MD, Ninah Moccio ([id number]) on 03/28/2013 12:15:52 PM            MDM   1. Vomiting   2. Weakness    I doubt any other EMC precluding discharge at this time including, but not necessarily limited to the following:sepsis.    Babette Relic, MD 03/29/13 [phone]

## 2013-03-28 NOTE — ED Notes (Signed)
Patient transported to X-ray 

## 2013-03-28 NOTE — ED Notes (Addendum)
Attempted to contact [place]. No answer. Voicemail full. Unable to leave voicemail.

## 2013-03-28 NOTE — Discharge Instructions (Signed)
Low Blood Sugar Low blood sugar (hypoglycemia) means that the level of sugar in your blood is lower than it should be. Signs of low blood sugar include:  Getting sweaty.  Feeling hungry.  Feeling dizzy or weak.  Feeling sleepier than normal.  Feeling nervous.  Headaches.  Having a fast heartbeat. Low blood sugar can happen fast and can be an emergency. Your doctor can do tests to check your blood sugar level. You can have low blood sugar and not have diabetes. HOME CARE  Check your blood sugar as told by your doctor. If it is less than 70 mg/dl or as told by your doctor, take 1 of the following:  3 to 4 glucose tablets.   cup clear juice.   cup soda pop, not diet.  1 cup milk.  5 to 6 hard candies.  Recheck blood sugar after 15 minutes. Repeat until it is at the right level.  Eat a snack if it is more than 1 hour until the next meal.  Only take medicine as told by your doctor.  Do not skip meals. Eat on time.  Do not drink alcohol except with meals.  Check your blood glucose before driving.  Check your blood glucose before and after exercise.  Always carry treatment with you, such as glucose pills.  Always wear a medical alert bracelet if you have diabetes. GET HELP RIGHT AWAY IF:   Your blood glucose goes below 70 mg/dl or as told by your doctor, and you:  Are confused.  Are not able to swallow.  Pass out (faint).  You cannot treat yourself. You may need someone to help you.  You have low blood sugar problems often.  You have problems from your medicines.  You are not feeling better after 3 to 4 days.  You have vision changes. MAKE SURE YOU:   Understand these instructions.  Will watch this condition.  Will get help right away if you are not doing well or get worse. Document Released: 06/08/2009 Document Revised: 06/06/2011 Document Reviewed: 06/08/2009 [facility] Patient Information 2014 [place], [place].  Follow other discharge directions  supplied today.

## 2013-03-29 LAB — GLUCOSE, CAPILLARY: Glucose-Capillary: 56 mg/dL — ABNORMAL LOW (ref 70–99)

## 2013-04-04 ENCOUNTER — Ambulatory Visit (INDEPENDENT_AMBULATORY_CARE_PROVIDER_SITE_OTHER): Payer: Medicare Other | Admitting: Internal Medicine

## 2013-04-04 ENCOUNTER — Encounter: Payer: Self-pay | Admitting: Internal Medicine

## 2013-04-04 ENCOUNTER — Other Ambulatory Visit: Payer: Self-pay | Admitting: Internal Medicine

## 2013-04-04 VITALS — BP 188/80 | HR 56 | Temp 97.9°F | Resp 16 | Wt 167.0 lb

## 2013-04-04 DIAGNOSIS — E1029 Type 1 diabetes mellitus with other diabetic kidney complication: Secondary | ICD-10-CM

## 2013-04-04 DIAGNOSIS — E559 Vitamin D deficiency, unspecified: Secondary | ICD-10-CM

## 2013-04-04 DIAGNOSIS — I10 Essential (primary) hypertension: Secondary | ICD-10-CM

## 2013-04-04 DIAGNOSIS — Z79899 Other long term (current) drug therapy: Secondary | ICD-10-CM

## 2013-04-04 DIAGNOSIS — E782 Mixed hyperlipidemia: Secondary | ICD-10-CM

## 2013-04-04 LAB — LIPID PANEL
Cholesterol: 110 mg/dL (ref 0–200)
HDL: 43 mg/dL
LDL Cholesterol: 46 mg/dL (ref 0–99)
Total CHOL/HDL Ratio: 2.6 ratio
Triglycerides: 107 mg/dL
VLDL: 21 mg/dL (ref 0–40)

## 2013-04-04 LAB — BASIC METABOLIC PANEL WITH GFR
BUN: 15 mg/dL (ref 6–23)
CO2: 28 mEq/L (ref 19–32)
Calcium: 9 mg/dL (ref 8.4–10.5)
Chloride: 107 mEq/L (ref 96–112)
Creat: 1.47 mg/dL — ABNORMAL HIGH (ref 0.50–1.35)
GFR, EST AFRICAN AMERICAN: 46 mL/min — AB
GFR, Est Non African American: 40 mL/min — ABNORMAL LOW
GLUCOSE: 105 mg/dL — AB (ref 70–99)
POTASSIUM: 4.4 meq/L (ref 3.5–5.3)
Sodium: 144 mEq/L (ref 135–145)

## 2013-04-04 LAB — CBC WITH DIFFERENTIAL/PLATELET
BASOS PCT: 0 % (ref 0–1)
Basophils Absolute: 0 10*3/uL (ref 0.0–0.1)
EOS ABS: 0.2 10*3/uL (ref 0.0–0.7)
Eosinophils Relative: 4 % (ref 0–5)
HEMATOCRIT: 36.5 % — AB (ref 39.0–52.0)
Hemoglobin: 12.1 g/dL — ABNORMAL LOW (ref 13.0–17.0)
Lymphocytes Relative: 32 % (ref 12–46)
Lymphs Abs: 1.5 10*3/uL (ref 0.7–4.0)
MCH: 28.4 pg (ref 26.0–34.0)
MCHC: 33.2 g/dL (ref 30.0–36.0)
MCV: 85.7 fL (ref 78.0–100.0)
MONOS PCT: 7 % (ref 3–12)
Monocytes Absolute: 0.3 10*3/uL (ref 0.1–1.0)
NEUTROS ABS: 2.6 10*3/uL (ref 1.7–7.7)
Neutrophils Relative %: 57 % (ref 43–77)
Platelets: 222 10*3/uL (ref 150–400)
RBC: 4.26 MIL/uL (ref 4.22–5.81)
RDW: 14.5 % (ref 11.5–15.5)
WBC: 4.7 10*3/uL (ref 4.0–10.5)

## 2013-04-04 LAB — HEPATIC FUNCTION PANEL
ALT: 13 U/L (ref 0–53)
AST: 15 U/L (ref 0–37)
Albumin: 3.7 g/dL (ref 3.5–5.2)
Alkaline Phosphatase: 90 U/L (ref 39–117)
BILIRUBIN INDIRECT: 0.2 mg/dL (ref 0.0–0.9)
Bilirubin, Direct: 0.1 mg/dL (ref 0.0–0.3)
TOTAL PROTEIN: 6.2 g/dL (ref 6.0–8.3)
Total Bilirubin: 0.3 mg/dL (ref 0.3–1.2)

## 2013-04-04 LAB — MAGNESIUM: Magnesium: 1.9 mg/dL (ref 1.5–2.5)

## 2013-04-04 LAB — HEMOGLOBIN A1C
Hgb A1c MFr Bld: 8 % — ABNORMAL HIGH
Mean Plasma Glucose: 183 mg/dL — ABNORMAL HIGH

## 2013-04-04 NOTE — Progress Notes (Signed)
Patient ID: [name], male   DOB: 1914-12-12, 80 y.o.   MRN: [id number]   This very nice 80 y.o. WBM presents for 3 month follow up with Hypertension, Hyperlipidemia, T1 IDDM and Vitamin D Deficiency.    HTN predates many years.Today's BP: 188/80 mmHg. Patient denies any cardiac type chest pain, palpitations, dyspnea/orthopnea/PND, dizziness, claudication, or dependent edema.   Hyperlipidemia is controlled with diet & meds. Last Cholesterol was 128, Triglycerides were 280, HDL 35 and LDL 37. Patient denies myalgias or other med SE's.    Also, the patient has history of T1 IDDM with last A1c of 7.8% in August 2014. He has a sliding scale for Lantus which apparently he has not been receiving for fear of hypoglycemia. He is maintaining on Metformin. Patient denies any recent symptoms of reactive hypoglycemia, diabetic polys, paresthesias or visual blurring.   Further, Patient has history of Vitamin D Deficiency with last vitamin D of 58 in Oct 2014. Patient supplements vitamin D without any suspected side-effects.  Medication Sig Dispense Refill  . aspirin EC 81 MG tablet Take 81 mg by mouth 3 (three) times a week. Monday, Wednesday and Friday evening      . atenolol (TENORMIN) 100 MG tablet Take 100 mg by mouth daily.       . B Complex Vitamins (VITAMIN B COMPLEX) TABS Take by mouth daily.      . benazepril (LOTENSIN) 20 MG tablet Take 20 mg by mouth every evening.       . Cholecalciferol (VITAMIN D-3) 1000 UNITS CAPS Take 1,000 Units by mouth every evening.       . gabapentin (NEURONTIN) 300 MG capsule Take 300 mg by mouth at bedtime.      Marland Kitchen ibuprofen (ADVIL,MOTRIN) 200 MG tablet Take 400 mg by mouth daily as needed for pain.      Marland Kitchen LANTUS SOLOSTAR 100 UNIT/ML injection Inject 10-15 Units into the skin as directed. If cbg < 120 take no lantus If cbg 120-160 take 10 units If cbg >160 take 15 units      . Magnesium 500 MG TABS Take 500 mg by mouth 3 (three) times daily.      . metFORMIN  (GLUCOPHAGE-XR) 500 MG 24 hr tablet TAKE 1-2 TABLETS BY MOUTH 2 TIMES DAILY AFTER MEALS AS DIRECTED  360 tablet  3  . metoCLOPramide (REGLAN) 10 MG tablet Take 1 tablet (10 mg total) by mouth every 6 (six) hours as needed for nausea (nausea/headache).  6 tablet  0  . neomycin-polymyxin-pramoxine (NEOSPORIN PLUS) 1 % cream Apply topically once. Change the dressing daily apply a small amount of the ointment to the blister sites until the skin has healed  14.2 g  0  . ondansetron (ZOFRAN ODT) 8 MG disintegrating tablet 8mg  ODT q4 hours prn nausea  4 tablet  0     Allergies  Allergen Reactions  . Allegra [Fexofenadine Hcl] Other (See Comments)    LBP    PMHx:   Past Medical History  Diagnosis Date  . Dementia   . Diabetes mellitus   . Hypertension   . Cancer     prostrate  . Cirrhosis   . GERD (gastroesophageal reflux disease)     FHx:    Reviewed / unchanged  SHx:    Reviewed / unchanged  Systems Review: Constitutional: Denies fever, chills, wt changes, headaches, insomnia, fatigue, night sweats, change in appetite. Eyes: Denies redness, blurred vision, diplopia, discharge, itchy, watery eyes.  ENT: Denies discharge, congestion,  post nasal drip, epistaxis, sore throat, earache, hearing loss, dental pain, tinnitus, vertigo, sinus pain, snoring.  CV: Denies chest pain, palpitations, irregular heartbeat, syncope, dyspnea, diaphoresis, orthopnea, PND, claudication, edema. Respiratory: denies cough, dyspnea, DOE, pleurisy, hoarseness, laryngitis, wheezing.  Gastrointestinal: Denies dysphagia, odynophagia, heartburn, reflux, water brash, abdominal pain or cramps, nausea, vomiting, bloating, diarrhea, constipation, hematemesis, melena, hematochezia,  or hemorrhoids. Genitourinary: Denies dysuria, frequency, urgency, nocturia, hesitancy, discharge, hematuria, flank pain. Musculoskeletal: Denies arthralgias, myalgias, stiffness, jt. swelling, pain, limp, strain/sprain.  Skin: Denies  pruritus, rash, hives, warts, acne, eczema, change in skin lesion(s). Neuro: No weakness, tremor, incoordination, spasms, paresthesia, or pain. Psychiatric: Denies confusion, memory loss, or sensory loss. Endo: Denies change in weight, skin, hair change.  Heme/Lymph: No excessive bleeding, bruising, orenlarged lymph nodes.  Filed Vitals:   04/04/13 1240  BP: 188/80               Rechecked at 146/68  Pulse: 56  Temp: 97.9 F (36.6 C)  Resp: 16    Estimated body mass index is 29.12 kg/(m^2) as calculated from the following:   Height as of 03/19/13: 5' 3.5" (1.613 m).   Weight as of this encounter: 167 lb (75.751 kg).  On Exam:  Appears well nourished - in no distress. Eyes: PERRLA, EOMs, conjunctiva no swelling or erythema. Sinuses: No frontal/maxillary tenderness ENT/Mouth: EAC's clear, TM's nl w/o erythema, bulging. Nares clear w/o erythema, swelling, exudates. Oropharynx clear without erythema or exudates. Oral hygiene is good. Tongue normal, non obstructing. Hearing intact.  Neck: Supple. Thyroid nl. Car 2+/2+ without bruits, nodes or JVD. Chest: Respirations nl with BS clear & equal w/o rales, rhonchi, wheezing or stridor.  Cor: Heart sounds normal w/ regular rate and rhythm without sig. murmurs, gallops, clicks, or rubs. Peripheral pulses normal and equal  without edema.  Abdomen: Soft & bowel sounds normal. Non-tender w/o guarding, rebound, hernias, masses, or organomegaly.  Lymphatics: Unremarkable.  Musculoskeletal: Full ROM all peripheral extremities, joint stability, 5/5 strength, and gait is stabilized with a walker.  Skin: Warm, dry without exposed rashes, lesions, ecchymosis apparent.  Neuro: Cranial nerves intact, reflexes equal bilaterally. Sensory-motor testing grossly intact. Tendon reflexes grossly intact.  Pysch: Alert & oriented x 3. Insight and judgement nl & appropriate. No ideations.  Assessment and Plan:  1. Hypertension - Continue monitor blood pressure at  home. Continue diet/meds same.  2. Hyperlipidemia - Continue diet/meds, exercise,& lifestyle modifications. Continue monitor periodic cholesterol/liver & renal functions   3. T1 IDDM - continue recommend prudent low glycemic diet, weight control, regular exercise, diabetic monitoring and periodic eye exams.  4. Vitamin D Deficiency - Continue supplementation.  Recommended regular exercise, BP monitoring, weight control, and discussed med and SE's. Recommended labs to assess and monitor clinical status. Further disposition pending results of labs.

## 2013-04-04 NOTE — Patient Instructions (Signed)
Continue diet & medications same as discussed.   Further disposition pending lab results.    Hypertension As your heart beats, it forces blood through your arteries. This force is your blood pressure. If the pressure is too high, it is called hypertension (HTN) or high blood pressure. HTN is dangerous because you may have it and not know it. High blood pressure may mean that your heart has to work harder to pump blood. Your arteries may be narrow or stiff. The extra work puts you at risk for heart disease, stroke, and other problems.  Blood pressure consists of two numbers, a higher number over a lower, 110/72, for example. It is stated as "110 over 72." The ideal is below 120 for the top number (systolic) and under 80 for the bottom (diastolic). Write down your blood pressure today. You should pay close attention to your blood pressure if you have certain conditions such as:  Heart failure.  Prior heart attack.  Diabetes  Chronic kidney disease.  Prior stroke.  Multiple risk factors for heart disease. To see if you have HTN, your blood pressure should be measured while you are seated with your arm held at the level of the heart. It should be measured at least twice. A one-time elevated blood pressure reading (especially in the Emergency Department) does not mean that you need treatment. There may be conditions in which the blood pressure is different between your right and left arms. It is important to see your caregiver soon for a recheck. Most people have essential hypertension which means that there is not a specific cause. This type of high blood pressure may be lowered by changing lifestyle factors such as:  Stress.  Smoking.  Lack of exercise.  Excessive weight.  Drug/tobacco/alcohol use.  Eating less salt. Most people do not have symptoms from high blood pressure until it has caused damage to the body. Effective treatment can often prevent, delay or reduce that  damage. TREATMENT  When a cause has been identified, treatment for high blood pressure is directed at the cause. There are a large number of medications to treat HTN. These fall into several categories, and your caregiver will help you select the medicines that are best for you. Medications may have side effects. You should review side effects with your caregiver. If your blood pressure stays high after you have made lifestyle changes or started on medicines,   Your medication(s) may need to be changed.  Other problems may need to be addressed.  Be certain you understand your prescriptions, and know how and when to take your medicine.  Be sure to follow up with your caregiver within the time frame advised (usually within two weeks) to have your blood pressure rechecked and to review your medications.  If you are taking more than one medicine to lower your blood pressure, make sure you know how and at what times they should be taken. Taking two medicines at the same time can result in blood pressure that is too low. SEEK IMMEDIATE MEDICAL CARE IF:  You develop a severe headache, blurred or changing vision, or confusion.  You have unusual weakness or numbness, or a faint feeling.  You have severe chest or abdominal pain, vomiting, or breathing problems. MAKE SURE YOU:   Understand these instructions.  Will watch your condition.  Will get help right away if you are not doing well or get worse. Document Released: 03/14/2005 Document Revised: 06/06/2011 Document Reviewed: 11/02/2007 [employer] Patient Information 2014 [employer],   [employer]   Type 1 Diabetes Mellitus, Adult Type 1 diabetes mellitus, often simply referred to as diabetes, is a long-term (chronic) disease. It occurs when the islet cells in the pancreas that make insulin (a hormone) are destroyed and can no longer make insulin. Insulin is needed to move sugars from food into the tissue cells. The tissue cells use the sugars for  energy. In people with type 1 diabetes, the sugars build up in the blood instead of going into the tissue cells. As a result, high blood sugar (hyperglycemia) develops. Without insulin, the body breaks down fat cells for the needed energy. This breakdown of fat cells produces acid chemicals (ketones), which increases the acid levels in the body. The effect of either high ketone or sugar (glucose) levels can be life-threatening.  Type 1 diabetes was also previously called juvenile diabetes. It most often occurs before the age of 30, but it can occur at any age. RISK FACTORS A person is predisposed to developing type 1 diabetes if someone in his or her family has the disease and is exposed to certain additional environmental triggers.  SYMPTOMS  Symptoms of type 1 diabetes may develop gradually over days to weeks or suddenly. The symptoms occur due to hyperglycemia. The symptoms can include:   Increased thirst (polydipsia).  Increased urination (polyuria).  Increased urination during the night (nocturia).  Weight loss. This weight loss may be rapid.  Frequent, recurring infections.  Tiredness (fatigue).  Weakness.  Vision changes, such as blurred vision.  Fruity smell to your breath.  Abdominal pain.  Nausea or vomiting. DIAGNOSIS  Type 1 diabetes is diagnosed when symptoms of diabetes are present and when blood glucose levels are increased. Your blood glucose level may be checked by one or more of the following blood tests:  A fasting blood glucose test. You will not be allowed to eat for at least 8 hours before a blood sample is taken.  A random blood glucose test. Your blood glucose is checked at any time of the day regardless of when you ate.  A hemoglobin A1c blood glucose test. A hemoglobin A1c test provides information about blood glucose control over the previous 3 months. TREATMENT  Although type 1 diabetes cannot be prevented, it can be managed with insulin, diet, and  exercise.  You will need to take insulin daily to keep blood glucose in the desired range.  You will need to match insulin dosing with exercise and healthy food choices. The treatment goal is to maintain the before-meal blood sugar (preprandial glucose) level at 70 130 mg/dL.  HOME CARE INSTRUCTIONS   Have your hemoglobin A1c level checked twice a year.  Perform daily blood glucose monitoring as directed by your caregiver.  Monitor urine ketones when you are ill and as directed by your caregiver.  Take your insulin as directed by your caregiver to maintain your blood glucose level in the desired range.  Never run out of insulin. It is needed every day.  Adjust insulin based on your intake of carbohydrates. Carbohydrates can raise blood glucose levels but need to be included in your diet. Carbohydrates provide vitamins, minerals, and fiber, which are an essential part of a healthy diet. Carbohydrates are found in fruits, vegetables, whole grains, dairy products, legumes, and foods containing added sugars.    Eat healthy foods. Alternate 3 meals with 3 snacks.  Maintain a healthy weight.  Carry a medical alert card or wear your medical alert jewelry.  Carry a 15   gram carbohydrate snack with you at all times to treat low blood glucose (hypoglycemia). Some examples of 15 gram carbohydrate snacks include:  Glucose tablets, 3 or 4.   Glucose gel, 15 gram tube.  Raisins, 2 tablespoons (24 grams).  Jelly beans, 6.  Animal crackers, 8.  Fruit juice, regular soda, or low-fat milk, 4 ounces (120 mL).  Gummy treats, 9.    Recognize hypoglycemia. Hypoglycemia occurs with blood glucose levels of 70 mg/dL and below. The risk for hypoglycemia increases when fasting or skipping meals, during or after intense exercise, and during sleep. Hypoglycemia symptoms can include:  Tremors or shakes.  Decreased ability to concentrate.  Sweating.  Increased heart rate.  Headache.  Dry  mouth.  Hunger.  Irritability.  Anxiety.  Restless sleep.  Altered speech or coordination.  Confusion.  Treat hypoglycemia promptly. If you are alert and able to safely swallow, follow the 15:15 rule:  Take 15 20 grams of rapid-acting glucose or carbohydrate. Rapid-acting options include glucose gel, glucose tablets, or 4 ounces (120 mL) of fruit juice, regular soda, or low-fat milk.  Check your blood glucose level 15 minutes after taking the glucose.   Take 15 20 grams more of glucose if the repeat blood glucose level is still 70 mg/dL or below.  Eat a meal or snack within 1 hour once blood glucose levels return to normal.  Be alert to polyuria and polydipsia, which are early signs of hyperglycemia. An early awareness of hyperglycemia allows for prompt treatment. Treat hyperglycemia as directed by your caregiver.  Engage in at least 150 minutes of moderate-intensity physical activity a week, spread over at least 3 days of the week or as directed by your caregiver.  Adjust your insulin dosing and food intake as needed if you start a new exercise or sport.  Follow your sick day plan at any time you are unable to eat or drink as usual.   Avoid tobacco use.  Limit alcohol intake to no more than 1 drink per day for nonpregnant women and 2 drinks per day for men. You should drink alcohol only when you are also eating food. Talk with your caregiver about whether alcohol is safe for you. Tell your caregiver if you drink alcohol several times a week.  Follow up with your caregiver regularly.  Schedule an eye exam within 5 years of diagnosis and then annually.  Perform daily skin and foot care. Examine your skin and feet daily for cuts, bruises, redness, nail problems, bleeding, blisters, or sores. A foot exam by a caregiver should be done annually.  Brush your teeth and gums at least twice a day and floss at least once a day. Follow up with your dentist regularly.  Share your  diabetes management plan with your workplace or school.  Stay up-to-date with immunizations.  Learn to manage stress.  Obtain ongoing diabetes education and support as needed.  Participate or seek rehabilitation as needed to maintain or improve independence and quality of life. Request a physical or occupational therapy referral if you are having foot or hand numbness or difficulties with grooming, dressing, eating, or physical activity. SEEK MEDICAL CARE IF:   You are unable to eat food or drink fluids for more than 6 hours.  You have nausea and vomiting for more than 6 hours.  Your blood glucose level is over 240 mg/dL.  There is a change in mental status.  You develop an additional serious illness.  You have diarrhea for more than 6   hours.  You have been sick or have had a fever for a couple of days and are not getting better.  You have pain during any physical activity. SEEK IMMEDIATE MEDICAL CARE IF:  You have difficulty breathing.  You have moderate to large ketone levels. MAKE SURE YOU:  Understand these instructions.  Will watch your condition.  Will get help right away if you are not doing well or get worse. Document Released: 03/11/2000 Document Revised: 12/07/2011 Document Reviewed: 10/11/2011 [employer] Patient Information 2014 [employer].   Cholesterol Cholesterol is a white, waxy, fat-like protein needed by your body in small amounts. The liver makes all the cholesterol you need. It is carried from the liver by the blood through the blood vessels. Deposits (plaque) may build up on blood vessel walls. This makes the arteries narrower and stiffer. Plaque increases the risk for heart attack and stroke. You cannot feel your cholesterol level even if it is very high. The only way to know is by a blood test to check your lipid (fats) levels. Once you know your cholesterol levels, you should keep a record of the test results. Work with your caregiver to to keep  your levels in the desired range. WHAT THE RESULTS MEAN:  Total cholesterol is a rough measure of all the cholesterol in your blood.  LDL is the so-called bad cholesterol. This is the type that deposits cholesterol in the walls of the arteries. You want this level to be low.  HDL is the good cholesterol because it cleans the arteries and carries the LDL away. You want this level to be high.  Triglycerides are fat that the body can either burn for energy or store. High levels are closely linked to heart disease. DESIRED LEVELS:  Total cholesterol below 200.  LDL below 100 for people at risk, below 70 for very high risk.  HDL above 50 is good, above 60 is best.  Triglycerides below 150. HOW TO LOWER YOUR CHOLESTEROL:  Diet.  Choose fish or white meat chicken and turkey, roasted or baked. Limit fatty cuts of red meat, fried foods, and processed meats, such as sausage and lunch meat.  Eat lots of fresh fruits and vegetables. Choose whole grains, beans, pasta, potatoes and cereals.  Use only small amounts of olive, corn or canola oils. Avoid butter, mayonnaise, shortening or palm kernel oils. Avoid foods with trans-fats.  Use skim/nonfat milk and low-fat/nonfat yogurt and cheeses. Avoid whole milk, cream, ice cream, egg yolks and cheeses. Healthy desserts include angel food cake, ginger snaps, animal crackers, hard candy, popsicles, and low-fat/nonfat frozen yogurt. Avoid pastries, cakes, pies and cookies.  Exercise.  A regular program helps decrease LDL and raises HDL.  Helps with weight control.  Do things that increase your activity level like gardening, walking, or taking the stairs.  Medication.  May be prescribed by your caregiver to help lowering cholesterol and the risk for heart disease.  You may need medicine even if your levels are normal if you have several risk factors. HOME CARE INSTRUCTIONS   Follow your diet and exercise programs as suggested by your  caregiver.  Take medications as directed.  Have blood work done when your caregiver feels it is necessary. MAKE SURE YOU:   Understand these instructions.  Will watch your condition.  Will get help right away if you are not doing well or get worse. Document Released: 12/07/2000 Document Revised: 06/06/2011 Document Reviewed: 05/30/2007 [employer] Patient Information 2014 [employer].   Vitamin D   Deficiency Vitamin D is an important vitamin that your body needs. Having too little of it in your body is called a deficiency. A very bad deficiency can make your bones soft and can cause a condition called rickets.  Vitamin D is important to your body for different reasons, such as:   It helps your body absorb 2 minerals called calcium and phosphorus.  It helps make your bones healthy.  It may prevent some diseases, such as diabetes and multiple sclerosis.  It helps your muscles and heart. You can get vitamin D in several ways. It is a natural part of some foods. The vitamin is also added to some dairy products and cereals. Some people take vitamin D supplements. Also, your body makes vitamin D when you are in the sun. It changes the sun's rays into a form of the vitamin that your body can use. CAUSES   Not eating enough foods that contain vitamin D.  Not getting enough sunlight.  Having certain digestive system diseases that make it hard to absorb vitamin D. These diseases include Crohn's disease, chronic pancreatitis, and cystic fibrosis.  Having a surgery in which part of the stomach or small intestine is removed.  Being obese. Fat cells pull vitamin D out of your blood. That means that obese people may not have enough vitamin D left in their blood and in other body tissues.  Having chronic kidney or liver disease. RISK FACTORS Risk factors are things that make you more likely to develop a vitamin D deficiency. They include:  Being older.  Not being able to get outside  very much.  Living in a nursing home.  Having had broken bones.  Having weak or thin bones (osteoporosis).  Having a disease or condition that changes how your body absorbs vitamin D.  Having dark skin.  Some medicines such as seizure medicines or steroids.  Being overweight or obese. SYMPTOMS Mild cases of vitamin D deficiency may not have any symptoms. If you have a very bad case, symptoms may include:  Bone pain.  Muscle pain.  Falling often.  Broken bones caused by a minor injury, due to osteoporosis. DIAGNOSIS A blood test is the best way to tell if you have a vitamin D deficiency. TREATMENT Vitamin D deficiency can be treated in different ways. Treatment for vitamin D deficiency depends on what is causing it. Options include:  Taking vitamin D supplements.  Taking a calcium supplement. Your caregiver will suggest what dose is best for you. HOME CARE INSTRUCTIONS  Take any supplements that your caregiver prescribes. Follow the directions carefully. Take only the suggested amount.  Have your blood tested 2 months after you start taking supplements.  Eat foods that contain vitamin D. Healthy choices include:  Fortified dairy products, cereals, or juices. Fortified means vitamin D has been added to the food. Check the label on the package to be sure.  Fatty fish like salmon or trout.  Eggs.  Oysters.  Do not use a tanning bed.  Keep your weight at a healthy level. Lose weight if you need to.  Keep all follow-up appointments. Your caregiver will need to perform blood tests to make sure your vitamin D deficiency is going away. SEEK MEDICAL CARE IF:  You have any questions about your treatment.  You continue to have symptoms of vitamin D deficiency.  You have nausea or vomiting.  You are constipated.  You feel confused.  You have severe abdominal or back pain. MAKE SURE YOU:    Understand these instructions.  Will watch your condition.  Will get  help right away if you are not doing well or get worse. Document Released: 06/06/2011 Document Revised: 07/09/2012 Document Reviewed: 06/06/2011 [employer] Patient Information 2014 [employer].  

## 2013-04-05 LAB — TSH: TSH: 0.647 u[IU]/mL (ref 0.350–4.500)

## 2013-04-05 LAB — VITAMIN D 25 HYDROXY (VIT D DEFICIENCY, FRACTURES): Vit D, 25-Hydroxy: 62 ng/mL (ref 30–89)

## 2013-05-19 ENCOUNTER — Emergency Department (HOSPITAL_COMMUNITY)
Admission: EM | Admit: 2013-05-19 | Discharge: 2013-05-19 | Disposition: A | Discharge status: Home / Self Care | Payer: Medicare Other | Attending: Emergency Medicine | Admitting: Emergency Medicine

## 2013-05-19 ENCOUNTER — Emergency Department (HOSPITAL_COMMUNITY): Payer: Medicare Other

## 2013-05-19 ENCOUNTER — Encounter (HOSPITAL_COMMUNITY): Payer: Self-pay | Admitting: Emergency Medicine

## 2013-05-19 DIAGNOSIS — Z8719 Personal history of other diseases of the digestive system: Secondary | ICD-10-CM | POA: Insufficient documentation

## 2013-05-19 DIAGNOSIS — Z794 Long term (current) use of insulin: Secondary | ICD-10-CM | POA: Insufficient documentation

## 2013-05-19 DIAGNOSIS — E119 Type 2 diabetes mellitus without complications: Secondary | ICD-10-CM | POA: Insufficient documentation

## 2013-05-19 DIAGNOSIS — Z87891 Personal history of nicotine dependence: Secondary | ICD-10-CM | POA: Insufficient documentation

## 2013-05-19 DIAGNOSIS — S92919A Unspecified fracture of unspecified toe(s), initial encounter for closed fracture: Secondary | ICD-10-CM | POA: Insufficient documentation

## 2013-05-19 DIAGNOSIS — Y939 Activity, unspecified: Secondary | ICD-10-CM | POA: Insufficient documentation

## 2013-05-19 DIAGNOSIS — I10 Essential (primary) hypertension: Secondary | ICD-10-CM | POA: Insufficient documentation

## 2013-05-19 DIAGNOSIS — N289 Disorder of kidney and ureter, unspecified: Secondary | ICD-10-CM | POA: Insufficient documentation

## 2013-05-19 DIAGNOSIS — Y9210 Unspecified residential institution as the place of occurrence of the external cause: Secondary | ICD-10-CM | POA: Insufficient documentation

## 2013-05-19 DIAGNOSIS — F0390 Unspecified dementia without behavioral disturbance: Secondary | ICD-10-CM | POA: Insufficient documentation

## 2013-05-19 DIAGNOSIS — W19XXXA Unspecified fall, initial encounter: Secondary | ICD-10-CM | POA: Insufficient documentation

## 2013-05-19 DIAGNOSIS — Z8546 Personal history of malignant neoplasm of prostate: Secondary | ICD-10-CM | POA: Insufficient documentation

## 2013-05-19 DIAGNOSIS — S92502A Displaced unspecified fracture of left lesser toe(s), initial encounter for closed fracture: Secondary | ICD-10-CM

## 2013-05-19 DIAGNOSIS — Z7982 Long term (current) use of aspirin: Secondary | ICD-10-CM | POA: Insufficient documentation

## 2013-05-19 DIAGNOSIS — R937 Abnormal findings on diagnostic imaging of other parts of musculoskeletal system: Secondary | ICD-10-CM | POA: Insufficient documentation

## 2013-05-19 DIAGNOSIS — Z79899 Other long term (current) drug therapy: Secondary | ICD-10-CM | POA: Insufficient documentation

## 2013-05-19 DIAGNOSIS — Z792 Long term (current) use of antibiotics: Secondary | ICD-10-CM | POA: Insufficient documentation

## 2013-05-19 LAB — URINALYSIS, ROUTINE W REFLEX MICROSCOPIC
BILIRUBIN URINE: NEGATIVE
Glucose, UA: 250 mg/dL — AB
HGB URINE DIPSTICK: NEGATIVE
Ketones, ur: NEGATIVE mg/dL
Nitrite: NEGATIVE
PROTEIN: 100 mg/dL — AB
Specific Gravity, Urine: 1.024 (ref 1.005–1.030)
Urobilinogen, UA: 0.2 mg/dL (ref 0.0–1.0)
pH: 5.5 (ref 5.0–8.0)

## 2013-05-19 LAB — I-STAT CHEM 8, ED
BUN: 42 mg/dL — AB (ref 6–23)
CALCIUM ION: 1.2 mmol/L (ref 1.13–1.30)
CHLORIDE: 104 meq/L (ref 96–112)
CREATININE: 1.8 mg/dL — AB (ref 0.50–1.35)
GLUCOSE: 226 mg/dL — AB (ref 70–99)
HCT: 40 % (ref 39.0–52.0)
Hemoglobin: 13.6 g/dL (ref 13.0–17.0)
Potassium: 4.9 mEq/L (ref 3.7–5.3)
Sodium: 144 mEq/L (ref 137–147)
TCO2: 31 mmol/L (ref 0–100)

## 2013-05-19 LAB — URINE MICROSCOPIC-ADD ON

## 2013-05-19 MED ORDER — HALOPERIDOL LACTATE 5 MG/ML IJ SOLN
2.0000 mg | Freq: Once | INTRAMUSCULAR | Status: AC
  Administered 2013-05-19: 2 mg via INTRAMUSCULAR
  Filled 2013-05-19: qty 1

## 2013-05-19 MED ORDER — SODIUM CHLORIDE 0.9 % IV BOLUS (SEPSIS)
1000.0000 mL | Freq: Once | INTRAVENOUS | Status: AC
  Administered 2013-05-19: 1000 mL via INTRAVENOUS

## 2013-05-19 NOTE — ED Notes (Signed)
Bed: JM42 Expected date: 05/19/13 Expected time: 12:57 PM Means of arrival: Ambulance Comments: Fall no injury

## 2013-05-19 NOTE — ED Notes (Signed)
Patient unable to sign for discharge 

## 2013-05-19 NOTE — Discharge Instructions (Signed)
Read the information below.  You may return to the Emergency Department at any time for worsening condition or any new symptoms that concern you.  There was a possible abnormal finding on the CT scan of the cervical spine (neck).  You have a fracture of the left 5th toe.  You may take over the counter pain medication for pain.  I have provided a prescription for a walker if needed for support.   Please also see your doctor this week to have your renal function rechecked.  Drink plenty of fluids.     Buddy Taping of Toes We have taped your toes together to keep them from moving. This is called "buddy taping" since we used a part of your own body to keep the injured part still. We placed soft padding between your toes to keep them from rubbing against each other. Buddy taping will help with healing and to reduce pain. Keep your toes buddy taped together for as long as directed by your caregiver. HOME CARE INSTRUCTIONS   Raise your injured area above the level of your heart while sitting or lying down. Prop it up with pillows.  An ice pack used every twenty minutes, while awake, for the first one to two days may be helpful. Put ice in a plastic bag and put a towel between the bag and your skin.  Watch for signs that the taping is too tight. These signs may be:  Numbness of your taped toes.  Coolness of your taped toes.  Color change in the area beyond the tape.  Increased pain.  If you have any of these signs, loosen or rewrap the tape. If you need to loosen or rewrap the buddy tape, make sure you use the padding again. SEEK IMMEDIATE MEDICAL CARE IF:   You have worse pain, swelling, inflammation (soreness), drainage or bleeding after you rewrap the tape.  Any new problems occur. MAKE SURE YOU:   Understand these instructions.  Will watch your condition.  Will get help right away if you are not doing well or get worse. Document Released: 12/17/2003 Document Revised: 06/06/2011  Document Reviewed: 03/11/2008 [facility] Patient Information 2014 [place].  Fall Prevention in Hospitals As a hospital patient, your condition and the treatments you receive can increase your risk for falls. Some additional risk factors for falls in a hospital include:  Being in an unfamiliar environment.  Being on bed rest.  Your surgery.  Taking certain medicines.  Your tubing requirements, such as intravenous (IV) therapy or catheters. It is important that you learn how to decrease fall risks while at the hospital. Below are important tips that can help prevent falls. SAFETY TIPS FOR PREVENTING FALLS Talk about your risk of falling.  Ask your caregiver why you are at risk for falling. Is it your medicine, illness, tubing placement, or something else?  Make a plan with your caregiver to keep you safe from falls.  Ask your caregiver or pharmacist about side effect of your medicines. Some medicines can make you dizzy or affect your coordination. Ask for help.  Ask for help before getting out of bed. You may need to press your call button.  Ask for assistance in getting you safely to the toilet.  Ask for a walker or cane to be put at your bedside. Ask that most of the side rails on your bed be placed up before your caregiver leaves the room.  Ask family or friends to sit with you.  Ask for  things that are out of your reach, such as your glasses, hearing aids, telephone, bedside table, or call button. Follow these tips to avoid falling:  Stay lying or seated, rather than standing, while waiting for help.  Wear rubber-soled slippers or shoes whenever you walk in the hospital.  Avoid quick, sudden movements.  Change positions slowly.  Sit on the side of your bed before standing.  Stand up slowly and wait before you start to walk.  Let your caregiver know if there is a spill on the floor.  Pay careful attention to the medical equipment, electrical cords, and tubes  around you.  When you need help, use your call button by your bed or in the bathroom. Wait for one of your caregivers to help you.  If you feel dizzy or unsure of your footing, return to bed and wait for assistance.  Avoid being distracted by the TV, telephone, or another person in your room.  Do not lean or support yourself on rolling objects, such as IV poles or bedside tables. Document Released: 03/11/2000 Document Revised: 02/29/2012 Document Reviewed: 11/20/2011 [facility] Patient Information 2014 [place], [place].  Toe Fracture Your caregiver has diagnosed you as having a fractured toe. A toe fracture is a break in the bone of a toe. "Buddy taping" is a way of splinting your broken toe, by taping the broken toe to the toe next to it. This "buddy taping" will keep the injured toe from moving beyond normal range of motion. Buddy taping also helps the toe heal in a more normal alignment. It may take 6 to 8 weeks for the toe injury to heal. [place] your toes taped together for as long as directed by your caregiver or until you see a doctor for a follow-up examination. You can change the tape after bathing. Always use a small piece of gauze or cotton between the toes when taping them together. This will help the skin stay dry and prevent infection.  Apply ice to the injury for 15-20 minutes each hour while awake for the first 2 days. Put the ice in a plastic bag and place a towel between the bag of ice and your skin.  After the first 2 days, apply heat to the injured area. Use heat for the next 2 to 3 days. Place a heating pad on the foot or soak the foot in warm water as directed by your caregiver.  Keep your foot elevated as much as possible to lessen swelling.  Wear sturdy, supportive shoes. The shoes should not pinch the toes or fit tightly against the toes.  Your caregiver may prescribe a rigid shoe if your foot is very swollen.  Your may be given crutches if the  pain is too great and it hurts too much to walk.  Only take over-the-counter or prescription medicines for pain, discomfort, or fever as directed by your caregiver.  If your caregiver has given you a follow-up appointment, it is very important to keep that appointment. Not keeping the appointment could result in a chronic or permanent injury, pain, and disability. If there is any problem keeping the appointment, you must call back to this facility for assistance. SEEK MEDICAL CARE IF:   You have increased pain or swelling, not relieved with medications.  The pain does not get better after 1 week.  Your injured toe is cold when the others are warm. SEEK IMMEDIATE MEDICAL CARE IF:   The toe becomes cold, numb, or  white.  The toe becomes hot (inflamed) and red. Document Released: 03/11/2000 Document Revised: 06/06/2011 Document Reviewed: 10/29/2007 [facility] Patient Information 2014 [place].

## 2013-05-19 NOTE — ED Provider Notes (Signed)
CSN: [id number]     Arrival date & time 05/19/13  1253 History   First MD Initiated Contact with Patient 05/19/13 1414     Chief Complaint  Patient presents with  . Fall     (Consider location/radiation/quality/duration/timing/severity/associated sxs/prior Treatment) HPI Patient with dementia brought in by EMS following unwitnessed fall at nursing home with no complaints.  Staff heard patient fall and found him on the floor, conscious.  Denies any pain.  Level V caveat for dementia.   Past Medical History  Diagnosis Date  . Dementia   . Diabetes mellitus   . Hypertension   . Cancer     prostrate  . Cirrhosis   . GERD (gastroesophageal reflux disease)    Past Surgical History  Procedure Laterality Date  . Appendectomy     Family History  Problem Relation Age of Onset  . Diabetes Mother    History  Substance Use Topics  . Smoking status: Former Smoker    Quit date: 03/29/1971  . Smokeless tobacco: Not on file  . Alcohol Use: No    Review of Systems  Unable to perform ROS: Dementia      Allergies  Allegra  Home Medications   Current Outpatient Rx  Name  Route  Sig  Dispense  Refill  . aspirin EC 81 MG tablet   Oral   Take 81 mg by mouth daily.          Marland Kitchen atenolol (TENORMIN) 100 MG tablet   Oral   Take 100 mg by mouth daily.          . B Complex Vitamins (VITAMIN B COMPLEX) TABS   Oral   Take 1 tablet by mouth daily.          . benazepril (LOTENSIN) 20 MG tablet   Oral   Take 20 mg by mouth daily.          . Cholecalciferol (VITAMIN D-3) 1000 UNITS CAPS   Oral   Take 1,000 Units by mouth every evening.          . gabapentin (NEURONTIN) 300 MG capsule   Oral   Take 300 mg by mouth at bedtime.         Marland Kitchen LANTUS SOLOSTAR 100 UNIT/ML injection   Subcutaneous   Inject 15 Units into the skin every morning.          . Magnesium 500 MG TABS   Oral   Take 500 mg by mouth 3 (three) times daily.         . metFORMIN (GLUCOPHAGE-XR)  500 MG 24 hr tablet   Oral   Take 1,000 mg by mouth every evening.         . neomycin-polymyxin-pramoxine (NEOSPORIN PLUS) 1 % cream   Topical   Apply 1 application topically every evening.         . white petrolatum (VASELINE) GEL   Topical   Apply 1 application topically at bedtime.          BP 166/69  Pulse 50  Temp(Src) 99 F (37.2 C)  Resp 18  SpO2 98% Physical Exam  Nursing note and vitals reviewed. Constitutional: He appears well-developed and well-nourished. No distress.  HENT:  Head: Normocephalic and atraumatic.  Neck: Neck supple.  Cardiovascular: Normal rate and regular rhythm.   Pulmonary/Chest: Effort normal and breath sounds normal. No respiratory distress. He has no wheezes. He has no rales.  Abdominal: Soft. He exhibits no distension and no mass. There  is no tenderness. There is no rebound and no guarding.  Musculoskeletal: He exhibits no edema.       Feet:  No other bony tenderness.  No edema.    Neurological: He is alert. He has normal strength. He exhibits normal muscle tone. GCS eye subscore is 4. GCS verbal subscore is 5. GCS motor subscore is 6.  Moves all extremities equally.   Skin: He is not diaphoretic.  Psychiatric:  Oriented to self.     ED Course  Procedures (including critical care time) Labs Review Labs Reviewed  URINALYSIS, ROUTINE W REFLEX MICROSCOPIC - Abnormal; Notable for the following:    APPearance HAZY (*)    Glucose, UA 250 (*)    Protein, ur 100 (*)    Leukocytes, UA SMALL (*)    All other components within normal limits  I-STAT CHEM 8, ED - Abnormal; Notable for the following:    BUN 42 (*)    Creatinine, Ser 1.80 (*)    Glucose, Bld 226 (*)    All other components within normal limits  URINE MICROSCOPIC-ADD ON   Imaging Review Dg Chest 2 View  05/19/2013   CLINICAL DATA:  Left foot pain.  Confusion and combative  EXAM: CHEST  2 VIEW  COMPARISON:  03/28/2013  FINDINGS: Normal heart size. No pleural effusion or  edema. Lung volumes appear low. Mild spondylosis identified within the thoracic spine.  IMPRESSION: 1. Low lung volumes.   Electronically Signed   By: Kerby Moors M.D.   On: 05/19/2013 15:17   Ct Head Wo Contrast  05/19/2013   CLINICAL DATA:  [place] at nursing home, history dementia, diabetes, hypertension, prostate cancer  EXAM: CT HEAD WITHOUT CONTRAST  CT CERVICAL SPINE WITHOUT CONTRAST  TECHNIQUE: Multidetector CT imaging of the head and cervical spine was performed following the standard protocol without intravenous contrast. Multiplanar CT image reconstructions of the cervical spine were also generated.  COMPARISON:  CT head 12/26/2011  FINDINGS: CT HEAD FINDINGS  Generalized atrophy.  Normal ventricular morphology.  No midline shift or mass effect.  Small vessel chronic ischemic changes of deep cerebral white matter.  No intracranial hemorrhage, mass lesion, or acute infarction.  Mucosal retention cyst right maxillary sinus.  Visualized paranasal sinuses and mastoid air cells otherwise clear.  Bones unremarkable.  Minimal atherosclerotic calcification carotid siphons.  CT CERVICAL SPINE FINDINGS  Prevertebral soft tissues normal thickness.  Osseous mineralization normal.  Scattered endplate spur formation and disc space narrowing throughout cervical spine.  Scattered facet degenerative changes bilaterally with probable ankylosis of the left C3-C4 facet joint.  Vertebral body heights maintained without fracture or subluxation.  Visualized skullbase intact.  Questionable subtle lucent lesion within the spinous process of C7, cannot exclude lytic/destructive lesion.  Lung apices clear.  IMPRESSION: No acute intracranial abnormalities.  Atrophy with minimal small vessel chronic ischemic changes of deep cerebral white matter.  Scattered degenerative disc and facet disease to changes throughout cervical spine.  No evidence acute cervical spine injury.  Questionable lucent process within the C7 spinous process,  nonspecific, cannot exclude a lytic metastatic lesion ; this would be atypical for prostate cancer which typically causes sclerotic metastases.   Electronically Signed   By: Lavonia Dana M.D.   On: 05/19/2013 15:02   Ct Cervical Spine Wo Contrast  05/19/2013   CLINICAL DATA:  [place] at nursing home, history dementia, diabetes, hypertension, prostate cancer  EXAM: CT HEAD WITHOUT CONTRAST  CT CERVICAL SPINE WITHOUT CONTRAST  TECHNIQUE: Multidetector CT  imaging of the head and cervical spine was performed following the standard protocol without intravenous contrast. Multiplanar CT image reconstructions of the cervical spine were also generated.  COMPARISON:  CT head 12/26/2011  FINDINGS: CT HEAD FINDINGS  Generalized atrophy.  Normal ventricular morphology.  No midline shift or mass effect.  Small vessel chronic ischemic changes of deep cerebral white matter.  No intracranial hemorrhage, mass lesion, or acute infarction.  Mucosal retention cyst right maxillary sinus.  Visualized paranasal sinuses and mastoid air cells otherwise clear.  Bones unremarkable.  Minimal atherosclerotic calcification carotid siphons.  CT CERVICAL SPINE FINDINGS  Prevertebral soft tissues normal thickness.  Osseous mineralization normal.  Scattered endplate spur formation and disc space narrowing throughout cervical spine.  Scattered facet degenerative changes bilaterally with probable ankylosis of the left C3-C4 facet joint.  Vertebral body heights maintained without fracture or subluxation.  Visualized skullbase intact.  Questionable subtle lucent lesion within the spinous process of C7, cannot exclude lytic/destructive lesion.  Lung apices clear.  IMPRESSION: No acute intracranial abnormalities.  Atrophy with minimal small vessel chronic ischemic changes of deep cerebral white matter.  Scattered degenerative disc and facet disease to changes throughout cervical spine.  No evidence acute cervical spine injury.  Questionable lucent process  within the C7 spinous process, nonspecific, cannot exclude a lytic metastatic lesion ; this would be atypical for prostate cancer which typically causes sclerotic metastases.   Electronically Signed   By: Lavonia Dana M.D.   On: 05/19/2013 15:02   Dg Foot Complete Left  05/19/2013   CLINICAL DATA:  Fall, left foot pain  EXAM: LEFT FOOT - COMPLETE 3+ VIEW  COMPARISON:  None.  FINDINGS: Bones are osteopenic. Nondisplaced fracture of the left fifth toe proximal phalanx noted. Degenerative changes of the left first MTP joint. Metatarsals appear intact. No definite soft tissue abnormality.  IMPRESSION: Nondisplaced fracture left fifth toe proximal phalanx.   Electronically Signed   By: Daryll Brod M.D.   On: 05/19/2013 15:21    EKG Interpretation    Date/Time:  Sunday May 19 2013 15:25:21 EST Ventricular Rate:  54 PR Interval:  231 QRS Duration: 83 QT Interval:  545 QTC Calculation: 517 R Axis:   -16 Text Interpretation:  Sinus rhythm Prolonged PR interval Borderline left axis deviation Borderline ST elevation in V2 which was seen prior. T wave inversion in V6  Prolonged QT interval Significant artifact limits acuracy of interpretation.  Confirmed by DOCHERTY  MD, [place] [phone]) on 05/19/2013 3:43:08 PM           Discussed patient and workup with Dr Tawnya Crook.   4:00 PM Patient has become very agitated, is fighting staff trying to get out of bed.  Talks about working for the [employer].  Have discussed with Dr Tawnya Crook who recommends Haldol 2mg  IM.   4:08 PM Pt has also been seen by Dr Tawnya Crook.  Renal insufficiency, c/w dehydration.  Will give IVF bolus.  I do not think hospitalization would be beneficial to this demented and otherwise asymptomatic patient.    MDM   Final diagnoses:  Fall  Fracture of fifth toe, left, closed  Abnormal CT of spine    Patient with dementia brought in after unwitnessed fall.  Pt has no complaints.  He is at his baseline mental status per nursing  home/EMS.  Fracture of 5th toe, buddy taped in ED.  Dehydration per labs BUN elevated, creatinine approximately at baseline.  Blood glucose also elevated 226.  Does possibly have  C7 irregularity that may be metastasis.  Pt to follow up with PCP.  Will need renal function checked.  Will give IVF and d/c back to facility.  Discussed plan with Dr Tawnya Crook who agrees.       [place], PA-C 05/19/13 [phone]

## 2013-05-19 NOTE — ED Notes (Signed)
Patient fell at the nursing home. The patient has no complaints and is pleasantly confused. The patient reports that he is not in any pain an where and per ems the nursing home staff did not report any injuries. Due to facility protocol he needed to be transported to hospital to be evaluated.

## 2013-05-19 NOTE — ED Provider Notes (Signed)
Medical screening examination/treatment/procedure(s) were performed by non-physician practitioner and as supervising physician I was immediately available for consultation/collaboration.  EKG Interpretation    Date/Time:  Sunday May 19 2013 15:25:21 EST Ventricular Rate:  54 PR Interval:  231 QRS Duration: 83 QT Interval:  545 QTC Calculation: 517 R Axis:   -16 Text Interpretation:  Sinus rhythm Prolonged PR interval Borderline left axis deviation Borderline ST elevation in V2 which was seen prior. T wave inversion in V6  Prolonged QT interval Significant artifact limits acuracy of interpretation.  Confirmed by Tawnya Crook  MD, [place] [phone]) on 05/19/2013 3:43:08 PM              Neta Ehlers, MD 05/19/13 [phone]

## 2013-05-20 ENCOUNTER — Emergency Department (HOSPITAL_COMMUNITY)
Admission: EM | Admit: 2013-05-20 | Discharge: 2013-05-20 | Disposition: A | Discharge status: Home / Self Care | Payer: Medicare Other | Attending: Emergency Medicine | Admitting: Emergency Medicine

## 2013-05-20 ENCOUNTER — Other Ambulatory Visit: Payer: Self-pay | Admitting: Internal Medicine

## 2013-05-20 ENCOUNTER — Encounter (HOSPITAL_COMMUNITY): Payer: Self-pay | Admitting: Emergency Medicine

## 2013-05-20 DIAGNOSIS — Y9389 Activity, other specified: Secondary | ICD-10-CM | POA: Insufficient documentation

## 2013-05-20 DIAGNOSIS — Y92129 Unspecified place in nursing home as the place of occurrence of the external cause: Secondary | ICD-10-CM

## 2013-05-20 DIAGNOSIS — Z043 Encounter for examination and observation following other accident: Secondary | ICD-10-CM | POA: Insufficient documentation

## 2013-05-20 DIAGNOSIS — F0390 Unspecified dementia without behavioral disturbance: Secondary | ICD-10-CM | POA: Insufficient documentation

## 2013-05-20 DIAGNOSIS — Z7982 Long term (current) use of aspirin: Secondary | ICD-10-CM | POA: Insufficient documentation

## 2013-05-20 DIAGNOSIS — E119 Type 2 diabetes mellitus without complications: Secondary | ICD-10-CM | POA: Insufficient documentation

## 2013-05-20 DIAGNOSIS — Z792 Long term (current) use of antibiotics: Secondary | ICD-10-CM | POA: Insufficient documentation

## 2013-05-20 DIAGNOSIS — W06XXXA Fall from bed, initial encounter: Secondary | ICD-10-CM | POA: Insufficient documentation

## 2013-05-20 DIAGNOSIS — Z8546 Personal history of malignant neoplasm of prostate: Secondary | ICD-10-CM | POA: Insufficient documentation

## 2013-05-20 DIAGNOSIS — I10 Essential (primary) hypertension: Secondary | ICD-10-CM | POA: Insufficient documentation

## 2013-05-20 DIAGNOSIS — Z79899 Other long term (current) drug therapy: Secondary | ICD-10-CM | POA: Insufficient documentation

## 2013-05-20 DIAGNOSIS — Z794 Long term (current) use of insulin: Secondary | ICD-10-CM | POA: Insufficient documentation

## 2013-05-20 DIAGNOSIS — Y9210 Unspecified residential institution as the place of occurrence of the external cause: Secondary | ICD-10-CM | POA: Insufficient documentation

## 2013-05-20 DIAGNOSIS — Z87891 Personal history of nicotine dependence: Secondary | ICD-10-CM | POA: Insufficient documentation

## 2013-05-20 DIAGNOSIS — Z8719 Personal history of other diseases of the digestive system: Secondary | ICD-10-CM | POA: Insufficient documentation

## 2013-05-20 DIAGNOSIS — W19XXXA Unspecified fall, initial encounter: Secondary | ICD-10-CM

## 2013-05-20 NOTE — ED Notes (Signed)
Bed: WA06 Expected date:  Expected time:  Means of arrival:  Comments: ems- 80 yo M

## 2013-05-20 NOTE — ED Provider Notes (Signed)
CSN: [id number]     Arrival date & time 05/20/13  1026 History   First MD Initiated Contact with Patient 05/20/13 1026     Chief Complaint  Patient presents with  . Fall     (Consider location/radiation/quality/duration/timing/severity/associated sxs/prior Treatment) The history is provided by the EMS personnel and the nursing home. The history is limited by the condition of the patient.  [name] is a 80 y.o. male hx of dementia, DM, HTN here with fall. He fell yesterday and was seen in the ED and had normal CT head. CT cervical spine showed possible lytic lesion and was told to followup. He also had left fifth distal phalanx fracture and was buddy taped to the fourth toe. He also has a creatinine of 1.8 and was hydrated in the ER and sent back to the nursing home. Today he was reported to the bed and was not witnessed. Patient has no complaints and denies headaches or chest pain or abdominal pain.      Level V caveat- dementia  Past Medical History  Diagnosis Date  . Dementia   . Diabetes mellitus   . Hypertension   . Cancer     prostrate  . Cirrhosis   . GERD (gastroesophageal reflux disease)    Past Surgical History  Procedure Laterality Date  . Appendectomy     Family History  Problem Relation Age of Onset  . Diabetes Mother    History  Substance Use Topics  . Smoking status: Former Smoker    Quit date: 03/29/1971  . Smokeless tobacco: Not on file  . Alcohol Use: No    Review of Systems  Unable to perform ROS: Dementia      Allergies  Allegra  Home Medications   Current Outpatient Rx  Name  Route  Sig  Dispense  Refill  . aspirin EC 81 MG tablet   Oral   Take 81 mg by mouth daily.          Marland Kitchen atenolol (TENORMIN) 100 MG tablet   Oral   Take 100 mg by mouth every evening.          . B Complex Vitamins (VITAMIN B COMPLEX) TABS   Oral   Take 1 tablet by mouth daily.          . benazepril (LOTENSIN) 20 MG tablet   Oral   Take 20 mg by  mouth daily.          . Cholecalciferol (VITAMIN D-3) 1000 UNITS CAPS   Oral   Take 1,000 Units by mouth daily.          Marland Kitchen gabapentin (NEURONTIN) 300 MG capsule   Oral   Take 300 mg by mouth at bedtime.         Marland Kitchen LANTUS SOLOSTAR 100 UNIT/ML injection   Subcutaneous   Inject 15 Units into the skin every morning.          . Magnesium 500 MG TABS   Oral   Take 500 mg by mouth 3 (three) times daily.         . metFORMIN (GLUCOPHAGE-XR) 500 MG 24 hr tablet   Oral   Take 1,000 mg by mouth every evening.         . white petrolatum (VASELINE) GEL   Topical   Apply 1 application topically at bedtime.         Marland Kitchen neomycin-polymyxin-pramoxine (NEOSPORIN PLUS) 1 % cream   Topical   Apply 1 application topically  every evening.          BP 165/58  Pulse 53  Temp(Src) 98 F (36.7 C) (Oral)  Resp 16  SpO2 97% Physical Exam  Nursing note and vitals reviewed. Constitutional:  Demented, NAD, calm   HENT:  Head: Normocephalic and atraumatic.  Mouth/Throat: Oropharynx is clear and moist.  Eyes: Conjunctivae are normal. Pupils are equal, round, and reactive to light.  Neck: Normal range of motion. Neck supple.  Cardiovascular: Normal rate, regular rhythm and normal heart sounds.   Pulmonary/Chest: Effort normal and breath sounds normal. No respiratory distress. He has no wheezes. He has no rales.  Abdominal: Soft. Bowel sounds are normal. He exhibits no distension. There is no tenderness. There is no rebound and no guarding.  Musculoskeletal: Normal range of motion.  Moving all extremities. No obvious signs of trauma other than L 5th toe that is mildly tender but buddy tape in place. Nl ROM bilateral hips. No midline spinal tenderness or deformity.   Neurological: He is alert.  Demented, moving all extremities   Skin: Skin is warm and dry.  Psychiatric:  Unable     ED Course  Procedures (including critical care time) Labs Review Labs Reviewed - No data to  display Imaging Review Dg Chest 2 View  05/19/2013   CLINICAL DATA:  Left foot pain.  Confusion and combative  EXAM: CHEST  2 VIEW  COMPARISON:  03/28/2013  FINDINGS: Normal heart size. No pleural effusion or edema. Lung volumes appear low. Mild spondylosis identified within the thoracic spine.  IMPRESSION: 1. Low lung volumes.   Electronically Signed   By: Kerby Moors M.D.   On: 05/19/2013 15:17   Ct Head Wo Contrast  05/19/2013   CLINICAL DATA:  [facility] at nursing home, history dementia, diabetes, hypertension, prostate cancer  EXAM: CT HEAD WITHOUT CONTRAST  CT CERVICAL SPINE WITHOUT CONTRAST  TECHNIQUE: Multidetector CT imaging of the head and cervical spine was performed following the standard protocol without intravenous contrast. Multiplanar CT image reconstructions of the cervical spine were also generated.  COMPARISON:  CT head 12/26/2011  FINDINGS: CT HEAD FINDINGS  Generalized atrophy.  Normal ventricular morphology.  No midline shift or mass effect.  Small vessel chronic ischemic changes of deep cerebral white matter.  No intracranial hemorrhage, mass lesion, or acute infarction.  Mucosal retention cyst right maxillary sinus.  Visualized paranasal sinuses and mastoid air cells otherwise clear.  Bones unremarkable.  Minimal atherosclerotic calcification carotid siphons.  CT CERVICAL SPINE FINDINGS  Prevertebral soft tissues normal thickness.  Osseous mineralization normal.  Scattered endplate spur formation and disc space narrowing throughout cervical spine.  Scattered facet degenerative changes bilaterally with probable ankylosis of the left C3-C4 facet joint.  Vertebral body heights maintained without fracture or subluxation.  Visualized skullbase intact.  Questionable subtle lucent lesion within the spinous process of C7, cannot exclude lytic/destructive lesion.  Lung apices clear.  IMPRESSION: No acute intracranial abnormalities.  Atrophy with minimal small vessel chronic ischemic changes of  deep cerebral white matter.  Scattered degenerative disc and facet disease to changes throughout cervical spine.  No evidence acute cervical spine injury.  Questionable lucent process within the C7 spinous process, nonspecific, cannot exclude a lytic metastatic lesion ; this would be atypical for prostate cancer which typically causes sclerotic metastases.   Electronically Signed   By: Lavonia Dana M.D.   On: 05/19/2013 15:02   Ct Cervical Spine Wo Contrast  05/19/2013   CLINICAL DATA:  [facility] at nursing  home, history dementia, diabetes, hypertension, prostate cancer  EXAM: CT HEAD WITHOUT CONTRAST  CT CERVICAL SPINE WITHOUT CONTRAST  TECHNIQUE: Multidetector CT imaging of the head and cervical spine was performed following the standard protocol without intravenous contrast. Multiplanar CT image reconstructions of the cervical spine were also generated.  COMPARISON:  CT head 12/26/2011  FINDINGS: CT HEAD FINDINGS  Generalized atrophy.  Normal ventricular morphology.  No midline shift or mass effect.  Small vessel chronic ischemic changes of deep cerebral white matter.  No intracranial hemorrhage, mass lesion, or acute infarction.  Mucosal retention cyst right maxillary sinus.  Visualized paranasal sinuses and mastoid air cells otherwise clear.  Bones unremarkable.  Minimal atherosclerotic calcification carotid siphons.  CT CERVICAL SPINE FINDINGS  Prevertebral soft tissues normal thickness.  Osseous mineralization normal.  Scattered endplate spur formation and disc space narrowing throughout cervical spine.  Scattered facet degenerative changes bilaterally with probable ankylosis of the left C3-C4 facet joint.  Vertebral body heights maintained without fracture or subluxation.  Visualized skullbase intact.  Questionable subtle lucent lesion within the spinous process of C7, cannot exclude lytic/destructive lesion.  Lung apices clear.  IMPRESSION: No acute intracranial abnormalities.  Atrophy with minimal small  vessel chronic ischemic changes of deep cerebral white matter.  Scattered degenerative disc and facet disease to changes throughout cervical spine.  No evidence acute cervical spine injury.  Questionable lucent process within the C7 spinous process, nonspecific, cannot exclude a lytic metastatic lesion ; this would be atypical for prostate cancer which typically causes sclerotic metastases.   Electronically Signed   By: Lavonia Dana M.D.   On: 05/19/2013 15:02   Dg Foot Complete Left  05/19/2013   CLINICAL DATA:  Fall, left foot pain  EXAM: LEFT FOOT - COMPLETE 3+ VIEW  COMPARISON:  None.  FINDINGS: Bones are osteopenic. Nondisplaced fracture of the left fifth toe proximal phalanx noted. Degenerative changes of the left first MTP joint. Metatarsals appear intact. No definite soft tissue abnormality.  IMPRESSION: Nondisplaced fracture left fifth toe proximal phalanx.   Electronically Signed   By: Daryll Brod M.D.   On: 05/19/2013 15:21    EKG Interpretation   None       MDM   Final diagnoses:  None  [name] is a 80 y.o. male here after rolling out of bed and fall. Given that he had normal CT head yesterday and no neck pain today and not on anticoagulation and has no signs of trauma, will not need to repeat CT today. I think he is falling likely from dementia. Cr 1.8 yesterday and was hydrated in the ED. Doesn't appear dehydrated or septic. He also had nondisplaced L 5th distal phalanx fracture yesterday that is treated appropriately. I think he needs to be on fall precautions in the nursing home and is stable for d/c.      Wandra Arthurs, MD 05/20/13 1045

## 2013-05-20 NOTE — ED Notes (Signed)
Per EMS pt comes from [facility], for fall out of bed this am. Pt has no complaints today. Per facility staff pt did same thing yesterday but had c/o of ankle pain.

## 2013-05-20 NOTE — ED Notes (Signed)
[facility] notified about transport back to facility.  

## 2013-05-20 NOTE — Discharge Instructions (Signed)
He needs to be on fall precautions.   Stay hydrated.   Follow up with your doctor. He has left 5th toe fracture that needs to be followed up with repeat xray in a week.   Return to ER if he has falls, chest pain, headaches, vomiting.

## 2013-06-05 ENCOUNTER — Emergency Department (HOSPITAL_COMMUNITY): Payer: Medicare Other

## 2013-06-05 ENCOUNTER — Inpatient Hospital Stay (HOSPITAL_COMMUNITY)
Admission: EM | Admit: 2013-06-05 | Discharge: 2013-06-10 | DRG: 689 | Disposition: A | Discharge status: Hospice | Payer: Medicare Other | Attending: Internal Medicine | Admitting: Internal Medicine

## 2013-06-05 ENCOUNTER — Encounter (HOSPITAL_COMMUNITY): Payer: Self-pay | Admitting: Emergency Medicine

## 2013-06-05 DIAGNOSIS — Z87891 Personal history of nicotine dependence: Secondary | ICD-10-CM

## 2013-06-05 DIAGNOSIS — N390 Urinary tract infection, site not specified: Principal | ICD-10-CM

## 2013-06-05 DIAGNOSIS — K7460 Unspecified cirrhosis of liver: Secondary | ICD-10-CM | POA: Diagnosis present

## 2013-06-05 DIAGNOSIS — N189 Chronic kidney disease, unspecified: Secondary | ICD-10-CM

## 2013-06-05 DIAGNOSIS — Z833 Family history of diabetes mellitus: Secondary | ICD-10-CM

## 2013-06-05 DIAGNOSIS — I129 Hypertensive chronic kidney disease with stage 1 through stage 4 chronic kidney disease, or unspecified chronic kidney disease: Secondary | ICD-10-CM | POA: Diagnosis present

## 2013-06-05 DIAGNOSIS — C61 Malignant neoplasm of prostate: Secondary | ICD-10-CM

## 2013-06-05 DIAGNOSIS — Z7982 Long term (current) use of aspirin: Secondary | ICD-10-CM

## 2013-06-05 DIAGNOSIS — K219 Gastro-esophageal reflux disease without esophagitis: Secondary | ICD-10-CM

## 2013-06-05 DIAGNOSIS — G929 Unspecified toxic encephalopathy: Secondary | ICD-10-CM | POA: Diagnosis present

## 2013-06-05 DIAGNOSIS — Z794 Long term (current) use of insulin: Secondary | ICD-10-CM

## 2013-06-05 DIAGNOSIS — G9340 Encephalopathy, unspecified: Secondary | ICD-10-CM

## 2013-06-05 DIAGNOSIS — E119 Type 2 diabetes mellitus without complications: Secondary | ICD-10-CM

## 2013-06-05 DIAGNOSIS — F0390 Unspecified dementia without behavioral disturbance: Secondary | ICD-10-CM

## 2013-06-05 DIAGNOSIS — G92 Toxic encephalopathy: Secondary | ICD-10-CM | POA: Diagnosis present

## 2013-06-05 DIAGNOSIS — Z515 Encounter for palliative care: Secondary | ICD-10-CM

## 2013-06-05 DIAGNOSIS — R259 Unspecified abnormal involuntary movements: Secondary | ICD-10-CM

## 2013-06-05 DIAGNOSIS — R002 Palpitations: Secondary | ICD-10-CM

## 2013-06-05 DIAGNOSIS — N400 Enlarged prostate without lower urinary tract symptoms: Secondary | ICD-10-CM | POA: Diagnosis present

## 2013-06-05 DIAGNOSIS — R1312 Dysphagia, oropharyngeal phase: Secondary | ICD-10-CM | POA: Diagnosis present

## 2013-06-05 DIAGNOSIS — I10 Essential (primary) hypertension: Secondary | ICD-10-CM

## 2013-06-05 DIAGNOSIS — N184 Chronic kidney disease, stage 4 (severe): Secondary | ICD-10-CM | POA: Diagnosis present

## 2013-06-05 DIAGNOSIS — Z66 Do not resuscitate: Secondary | ICD-10-CM | POA: Diagnosis present

## 2013-06-05 DIAGNOSIS — R4182 Altered mental status, unspecified: Secondary | ICD-10-CM

## 2013-06-05 LAB — COMPREHENSIVE METABOLIC PANEL
ALBUMIN: 2.9 g/dL — AB (ref 3.5–5.2)
ALK PHOS: 105 U/L (ref 39–117)
ALT: 12 U/L (ref 0–53)
AST: 13 U/L (ref 0–37)
BILIRUBIN TOTAL: 0.4 mg/dL (ref 0.3–1.2)
BUN: 24 mg/dL — AB (ref 6–23)
CHLORIDE: 98 meq/L (ref 96–112)
CO2: 28 meq/L (ref 19–32)
CREATININE: 1.46 mg/dL — AB (ref 0.50–1.35)
Calcium: 9.3 mg/dL (ref 8.4–10.5)
GFR calc Af Amer: 45 mL/min — ABNORMAL LOW (ref >90)
GFR calc non Af Amer: 39 mL/min — ABNORMAL LOW (ref >90)
Glucose, Bld: 312 mg/dL — ABNORMAL HIGH (ref 70–99)
Potassium: 4.4 mEq/L (ref 3.7–5.3)
Sodium: 139 mEq/L (ref 137–147)
Total Protein: 7 g/dL (ref 6.0–8.3)

## 2013-06-05 LAB — URINALYSIS, ROUTINE W REFLEX MICROSCOPIC
Bilirubin Urine: NEGATIVE
Glucose, UA: 250 mg/dL — AB
Ketones, ur: NEGATIVE mg/dL
Nitrite: NEGATIVE
PH: 8 (ref 5.0–8.0)
Protein, ur: 100 mg/dL — AB
Specific Gravity, Urine: 1.019 (ref 1.005–1.030)
Urobilinogen, UA: 0.2 mg/dL (ref 0.0–1.0)

## 2013-06-05 LAB — CBC
HEMATOCRIT: 46.3 % (ref 39.0–52.0)
HEMOGLOBIN: 13.3 g/dL (ref 13.0–17.0)
MCH: 28.2 pg (ref 26.0–34.0)
MCHC: 28.7 g/dL — ABNORMAL LOW (ref 30.0–36.0)
MCV: 98.3 fL (ref 78.0–100.0)
Platelets: 155 10*3/uL (ref 150–400)
RBC: 4.71 MIL/uL (ref 4.22–5.81)
RDW: 14.5 % (ref 11.5–15.5)
WBC: 10 10*3/uL (ref 4.0–10.5)

## 2013-06-05 LAB — URINE MICROSCOPIC-ADD ON

## 2013-06-05 LAB — GLUCOSE, CAPILLARY: GLUCOSE-CAPILLARY: 227 mg/dL — AB (ref 70–99)

## 2013-06-05 LAB — I-STAT TROPONIN, ED: Troponin i, poc: 0.06 ng/mL (ref 0.00–0.08)

## 2013-06-05 LAB — PROTIME-INR
INR: 1.05 (ref 0.00–1.49)
PROTHROMBIN TIME: 13.5 s (ref 11.6–15.2)

## 2013-06-05 LAB — CBG MONITORING, ED: Glucose-Capillary: 293 mg/dL — ABNORMAL HIGH (ref 70–99)

## 2013-06-05 MED ORDER — INSULIN ASPART 100 UNIT/ML ~~LOC~~ SOLN
0.0000 [IU] | Freq: Three times a day (TID) | SUBCUTANEOUS | Status: DC
  Administered 2013-06-06 (×2): 3 [IU] via SUBCUTANEOUS
  Administered 2013-06-06: 1 [IU] via SUBCUTANEOUS
  Administered 2013-06-07: 3 [IU] via SUBCUTANEOUS
  Administered 2013-06-08 (×3): 1 [IU] via SUBCUTANEOUS
  Administered 2013-06-09 (×2): 2 [IU] via SUBCUTANEOUS

## 2013-06-05 MED ORDER — HYDRALAZINE HCL 20 MG/ML IJ SOLN
10.0000 mg | Freq: Four times a day (QID) | INTRAMUSCULAR | Status: DC | PRN
  Administered 2013-06-06 – 2013-06-09 (×4): 10 mg via INTRAVENOUS
  Filled 2013-06-05 (×4): qty 1

## 2013-06-05 MED ORDER — ONDANSETRON HCL 4 MG PO TABS: 4.0000 mg | ORAL_TABLET | Freq: Four times a day (QID) | ORAL | Status: DC | PRN

## 2013-06-05 MED ORDER — MAGNESIUM OXIDE 400 (241.3 MG) MG PO TABS
400.0000 mg | ORAL_TABLET | Freq: Three times a day (TID) | ORAL | Status: DC
  Administered 2013-06-05 – 2013-06-08 (×6): 400 mg via ORAL
  Filled 2013-06-05 (×13): qty 1

## 2013-06-05 MED ORDER — ACETAMINOPHEN 325 MG PO TABS: 650.0000 mg | ORAL_TABLET | Freq: Four times a day (QID) | ORAL | Status: DC | PRN

## 2013-06-05 MED ORDER — ASPIRIN EC 81 MG PO TBEC
81.0000 mg | DELAYED_RELEASE_TABLET | Freq: Every day | ORAL | Status: DC
  Administered 2013-06-08: 81 mg via ORAL
  Filled 2013-06-05 (×4): qty 1

## 2013-06-05 MED ORDER — HYDRALAZINE HCL 20 MG/ML IJ SOLN: 10.0000 mg | Freq: Three times a day (TID) | INTRAMUSCULAR | Status: DC | PRN

## 2013-06-05 MED ORDER — INSULIN GLARGINE 100 UNIT/ML SOLOSTAR PEN: 15.0000 [IU] | PEN_INJECTOR | Freq: Every morning | SUBCUTANEOUS | Status: DC

## 2013-06-05 MED ORDER — BENAZEPRIL HCL 20 MG PO TABS
20.0000 mg | ORAL_TABLET | Freq: Every day | ORAL | Status: DC
  Administered 2013-06-08 – 2013-06-10 (×3): 20 mg via ORAL
  Filled 2013-06-05 (×5): qty 1

## 2013-06-05 MED ORDER — ONDANSETRON HCL 4 MG/2ML IJ SOLN: 4.0000 mg | Freq: Four times a day (QID) | INTRAMUSCULAR | Status: DC | PRN

## 2013-06-05 MED ORDER — SODIUM CHLORIDE 0.9 % IV SOLN
INTRAVENOUS | Status: AC
  Administered 2013-06-05: 18:00:00 via INTRAVENOUS

## 2013-06-05 MED ORDER — PANTOPRAZOLE SODIUM 40 MG PO TBEC: 40.0000 mg | DELAYED_RELEASE_TABLET | Freq: Every day | ORAL | Status: DC

## 2013-06-05 MED ORDER — VITAMIN B COMPLEX PO TABS: 1.0000 | ORAL_TABLET | Freq: Every day | ORAL | Status: DC

## 2013-06-05 MED ORDER — ACETAMINOPHEN ER 650 MG PO TBCR: 650.0000 mg | EXTENDED_RELEASE_TABLET | Freq: Four times a day (QID) | ORAL | Status: DC | PRN

## 2013-06-05 MED ORDER — CYANOCOBALAMIN 500 MCG PO TABS
500.0000 ug | ORAL_TABLET | Freq: Every day | ORAL | Status: DC
  Administered 2013-06-08: 500 ug via ORAL
  Filled 2013-06-05 (×4): qty 1

## 2013-06-05 MED ORDER — VITAMIN D-3 125 MCG (5000 UT) PO TABS: 1.0000 | ORAL_TABLET | Freq: Every day | ORAL | Status: DC

## 2013-06-05 MED ORDER — GABAPENTIN 100 MG PO CAPS
100.0000 mg | ORAL_CAPSULE | Freq: Every day | ORAL | Status: DC
  Administered 2013-06-05 – 2013-06-08 (×3): 100 mg via ORAL
  Filled 2013-06-05 (×5): qty 1

## 2013-06-05 MED ORDER — HEPARIN SODIUM (PORCINE) 5000 UNIT/ML IJ SOLN
5000.0000 [IU] | Freq: Three times a day (TID) | INTRAMUSCULAR | Status: DC
  Administered 2013-06-05 – 2013-06-09 (×12): 5000 [IU] via SUBCUTANEOUS
  Filled 2013-06-05 (×14): qty 1

## 2013-06-05 MED ORDER — VITAMIN D3 25 MCG (1000 UNIT) PO TABS
1000.0000 [IU] | ORAL_TABLET | Freq: Every day | ORAL | Status: DC
  Administered 2013-06-08: 1000 [IU] via ORAL
  Filled 2013-06-05 (×4): qty 1

## 2013-06-05 MED ORDER — SODIUM CHLORIDE 0.9 % IV SOLN
INTRAVENOUS | Status: AC
  Administered 2013-06-06: 10:00:00 via INTRAVENOUS

## 2013-06-05 MED ORDER — MAGNESIUM 500 MG PO TABS: 500.0000 mg | ORAL_TABLET | Freq: Three times a day (TID) | ORAL | Status: DC

## 2013-06-05 MED ORDER — CEFTRIAXONE SODIUM 1 G IJ SOLR
1.0000 g | INTRAMUSCULAR | Status: DC
  Administered 2013-06-05 – 2013-06-08 (×4): 1 g via INTRAVENOUS
  Filled 2013-06-05 (×5): qty 10

## 2013-06-05 MED ORDER — INSULIN GLARGINE 100 UNIT/ML ~~LOC~~ SOLN
15.0000 [IU] | Freq: Every day | SUBCUTANEOUS | Status: DC
  Administered 2013-06-06 – 2013-06-09 (×4): 15 [IU] via SUBCUTANEOUS
  Filled 2013-06-05 (×4): qty 0.15

## 2013-06-05 MED ORDER — ATENOLOL 25 MG PO TABS
100.0000 mg | ORAL_TABLET | Freq: Every evening | ORAL | Status: DC
  Administered 2013-06-05 – 2013-06-08 (×2): 100 mg via ORAL
  Filled 2013-06-05 (×6): qty 4

## 2013-06-05 NOTE — ED Notes (Signed)
EKG completed given to EDP.  

## 2013-06-05 NOTE — ED Notes (Signed)
Notified RN [facility] [id number]

## 2013-06-05 NOTE — H&P (Signed)
[facility] History and Physical  [name] NTI:[id number] DOB: 1914-11-08 DOA: 06/05/2013  Referring physician: Dr. Reather Converse PCP: Alesia Richards, MD   Chief Complaint: altered mental status   HPI: [name] is a 80 y.o. male with pmh significant for dementia, HTN, DM and CKD stage 3-4; transferred from SNF where he reside due to changes in mental status. Patient at baseline conversant and able to follow commands; today found to be unable to follow commands and barely speaking and just moaning. No focal deficit appreciated or reported.  SNF also endorses stinky/concentrated urine.  No fever, no elevation on WBC's; UA suggesting UTI. [facility] called to admit patient for further evaluation and treatment.  Of note, CT scan of head w/o acute intracranial abnormalities.    Review of Systems:  Unable to review secondary to dementia and encephalopathy.  Past Medical History  Diagnosis Date  . Dementia   . Diabetes mellitus   . Hypertension   . Cancer     prostrate  . Cirrhosis   . GERD (gastroesophageal reflux disease)    Past Surgical History  Procedure Laterality Date  . Appendectomy     Social History:  reports that he quit smoking about 42 years ago. He does not have any smokeless tobacco history on file. He reports that he does not drink alcohol or use illicit drugs.  Allergies  Allergen Reactions  . Allegra [Fexofenadine Hcl] Other (See Comments)    LBP    Family History  Problem Relation Age of Onset  . Diabetes Mother      Prior to Admission medications   Medication Sig Start Date End Date Taking? Authorizing Provider  acetaminophen (TYLENOL) 650 MG CR tablet Take 650 mg by mouth every 4 (four) hours as needed for pain.   Yes Historical Provider, MD  aspirin EC 81 MG tablet Take 81 mg by mouth daily.    Yes Historical Provider, MD  atenolol (TENORMIN) 100 MG tablet Take 100 mg by mouth every evening.  10/30/11  Yes Historical Provider, MD  B Complex  Vitamins (VITAMIN B COMPLEX) TABS Take 1 tablet by mouth daily.    Yes Historical Provider, MD  benazepril (LOTENSIN) 20 MG tablet Take 20 mg by mouth daily.    Yes Historical Provider, MD  Cholecalciferol (VITAMIN D-3) 5000 UNITS TABS Take 1 tablet by mouth daily.   Yes Historical Provider, MD  gabapentin (NEURONTIN) 300 MG capsule Take 300 mg by mouth at bedtime.   Yes Historical Provider, MD  LANTUS SOLOSTAR 100 UNIT/ML injection Inject 15 Units into the skin every morning.  10/18/11  Yes Historical Provider, MD  Magnesium 500 MG TABS Take 500 mg by mouth 3 (three) times daily.   Yes Historical Provider, MD  metFORMIN (GLUCOPHAGE-XR) 500 MG 24 hr tablet Take 1,000 mg by mouth every evening.   Yes Historical Provider, MD  white petrolatum (VASELINE) GEL Apply 1 application topically at bedtime.   Yes Historical Provider, MD   Physical Exam: Filed Vitals:   06/05/13 1800  BP: 180/63  Pulse: 57  Temp:   Resp: 14    BP 180/63  Pulse 57  Temp(Src) 98.2 F (36.8 C) (Oral)  Resp 14  SpO2 100%  General:  Appears calm, afebrile, unable to follow commands Eyes: PERRL, normal lids, irises & conjunctiva, no nystagmus ENT: dry MM, no erythema or exudates; poor dentition; no drainage out of ears or nostrils Neck: no LAD, masses or thyromegaly, no JVD Cardiovascular: RRR, no m/r/g. Trace LE edema.  Telemetry: SR, no arrhythmias  Respiratory: CTA bilaterally, no w/r/r. Normal respiratory effort. Abdomen: soft, nt, nd; positive BS Skin: no rash or induration seen on exam Musculoskeletal: decrease range of motion especially on RLE, pain to palpation around knee Neurologic: unable to follow commands; grossly exam non-focal; moves four limbs spontaneously          Labs on Admission:  Basic Metabolic Panel:  Recent Labs Lab 06/05/13 1302  NA 139  K 4.4  CL 98  CO2 28  GLUCOSE 312*  BUN 24*  CREATININE 1.46*  CALCIUM 9.3   Liver Function Tests:  Recent Labs Lab 06/05/13 1302    AST 13  ALT 12  ALKPHOS 105  BILITOT 0.4  PROT 7.0  ALBUMIN 2.9*   CBC:  Recent Labs Lab 06/05/13 1302  WBC 10.0  HGB 13.3  HCT 46.3  MCV 98.3  PLT 155   CBG:  Recent Labs Lab 06/05/13 1313  GLUCAP 293*    Radiological Exams on Admission: Dg Hip Complete Right  06/05/2013   CLINICAL DATA Right hip pain  EXAM RIGHT HIP - COMPLETE 2+ VIEW  COMPARISON None.  FINDINGS Mild degenerative changes of the right hip joint are noted. No acute fracture dislocation is noted. No gross soft tissue abnormality is seen.  IMPRESSION No acute abnormality noted.  SIGNATURE  Electronically Signed   By: Inez Catalina M.D.   On: 06/05/2013 14:31   Ct Head Wo Contrast  06/05/2013   CLINICAL DATA Altered mental status  EXAM CT HEAD WITHOUT CONTRAST  TECHNIQUE Contiguous axial images were obtained from the base of the skull through the vertex without intravenous contrast.  COMPARISON CT 05/19/2013  FINDINGS Generalized atrophy. Chronic microvascular ischemic changes. Negative for acute infarct. Negative for hemorrhage or mass.  Poorly developed mastoid tip on the right likely related to childhood infections. No acute bony change. Retention cyst right maxillary sinus.  IMPRESSION Atrophy and chronic microvascular ischemia.  No acute abnormality.  SIGNATURE  Electronically Signed   By: Franchot Gallo M.D.   On: 06/05/2013 14:55   Dg Chest Portable 1 View  06/05/2013   CLINICAL DATA Altered mental status.  EXAM PORTABLE CHEST - 1 VIEW  COMPARISON 05/19/2013  FINDINGS Bibasilar atelectasis. Heart is upper limits normal in size. No effusions. No acute bony abnormality.  IMPRESSION Bibasilar atelectasis.  SIGNATURE  Electronically Signed   By: Rolm Baptise M.D.   On: 06/05/2013 13:39   Dg Knee Complete 4 Views Right  06/05/2013   CLINICAL DATA Right knee pain  EXAM RIGHT KNEE - COMPLETE 4+ VIEW  COMPARISON None.  FINDINGS Mild osteophytic changes are noted medially. Some meniscal calcifications are seen. No  sizable joint effusion is noted. Soft tissue swelling is noted. No fractures are seen.  IMPRESSION Chronic changes without acute abnormality.  SIGNATURE  Electronically Signed   By: Inez Catalina M.D.   On: 06/05/2013 14:32    EKG:  Sinus rhythm, non-specific ST changes  Assessment/Plan 1-Encephalopathy acute: toxic due to presumed UTI. Patient with underlying dementia most likely worsen by infection. -gentle IVF's -follow urine culture -empirically started on rocephin -PRN antiemetics and antipyretics -will check TSH, B12 and RPR  2-DM: continue lantus and start SSI -will stop metformin while inpatient -low carb diet  3-HYPERTENSION: continue home medications -PRN hydralazine will be ordered  4-CHRONIC KIDNEY DISEASE UNSPECIFIED: stable and at baseline -will monitor  5-Dementia: continue supportive care  6-GERD (gastroesophageal reflux disease): continue PPI  DVT: heparin.  Code Status: Full Family Communication: no family at bedside Disposition Plan: inpatient, med-surg bed, LOS > 2 midnights  Time spent:   Zerita Boers [facility] Pager [phone]

## 2013-06-05 NOTE — ED Provider Notes (Signed)
CSN: [id number]     Arrival date & time 06/05/13  1246 History   First MD Initiated Contact with Patient 06/05/13 1252     Chief Complaint  Patient presents with  . Altered Mental Status     (Consider location/radiation/quality/duration/timing/severity/associated sxs/prior Treatment) Patient is a 80 y.o. male presenting with altered mental status.  Altered Mental Status Presenting symptoms: confusion   Severity:  Moderate Most recent episode:  Today Episode history:  Continuous Duration: today. Timing:  Constant Progression:  Unchanged Chronicity:  New Context: dementia   Associated symptoms: no fever     Past Medical History  Diagnosis Date  . Dementia   . Diabetes mellitus   . Hypertension   . Cancer     prostrate  . Cirrhosis   . GERD (gastroesophageal reflux disease)    Past Surgical History  Procedure Laterality Date  . Appendectomy     Family History  Problem Relation Age of Onset  . Diabetes Mother    History  Substance Use Topics  . Smoking status: Former Smoker    Quit date: 03/29/1971  . Smokeless tobacco: Not on file  . Alcohol Use: No    Review of Systems  Unable to perform ROS: Mental status change  Constitutional: Negative for fever.  Psychiatric/Behavioral: Positive for confusion.      Allergies  Allegra  Home Medications   Current Outpatient Rx  Name  Route  Sig  Dispense  Refill  . acetaminophen (TYLENOL) 650 MG CR tablet   Oral   Take 650 mg by mouth every 4 (four) hours as needed for pain.         Marland Kitchen aspirin EC 81 MG tablet   Oral   Take 81 mg by mouth daily.          Marland Kitchen atenolol (TENORMIN) 100 MG tablet   Oral   Take 100 mg by mouth every evening.          . B Complex Vitamins (VITAMIN B COMPLEX) TABS   Oral   Take 1 tablet by mouth daily.          . benazepril (LOTENSIN) 20 MG tablet   Oral   Take 20 mg by mouth daily.          . Cholecalciferol (VITAMIN D-3) 5000 UNITS TABS   Oral   Take 1 tablet by  mouth daily.         Marland Kitchen gabapentin (NEURONTIN) 300 MG capsule   Oral   Take 300 mg by mouth at bedtime.         Marland Kitchen LANTUS SOLOSTAR 100 UNIT/ML injection   Subcutaneous   Inject 15 Units into the skin every morning.          . Magnesium 500 MG TABS   Oral   Take 500 mg by mouth 3 (three) times daily.         . metFORMIN (GLUCOPHAGE-XR) 500 MG 24 hr tablet   Oral   Take 1,000 mg by mouth every evening.         . white petrolatum (VASELINE) GEL   Topical   Apply 1 application topically at bedtime.          BP 190/77  Pulse 58  Temp(Src) 98.2 F (36.8 C) (Oral)  Resp 16  SpO2 100% Physical Exam  Nursing note and vitals reviewed. Constitutional: He is oriented to person, place, and time. He appears well-developed and well-nourished. No distress.  HENT:  Head: Normocephalic and  atraumatic.  Mouth/Throat: Oropharynx is clear and moist.  Eyes: Conjunctivae are normal. Pupils are equal, round, and reactive to light. No scleral icterus.  Neck: Neck supple.  Cardiovascular: Normal rate, regular rhythm, normal heart sounds and intact distal pulses.   No murmur heard. Pulmonary/Chest: Effort normal and breath sounds normal. No stridor. No respiratory distress. He has no wheezes. He has no rales.  Abdominal: Soft. He exhibits no distension. There is no tenderness.  Musculoskeletal: Normal range of motion. He exhibits no edema.  RLE: 1+ edema.  Apparent pain with ROM of knee and hip without visible deformities.  2+ distal pulses.  No redness or warmth.  Neurological: He is alert and oriented to person, place, and time. GCS eye subscore is 3. GCS verbal subscore is 4. GCS motor subscore is 5.  Moves all extremities.  Does not follow commands.  Skin: Skin is warm and dry. No rash noted.  Psychiatric:  Unable to test.    ED Course  Procedures (including critical care time) Labs Review Labs Reviewed  CBC - Abnormal; Notable for the following:    MCHC 28.7 (*)    All  other components within normal limits  COMPREHENSIVE METABOLIC PANEL - Abnormal; Notable for the following:    Glucose, Bld 312 (*)    BUN 24 (*)    Creatinine, Ser 1.46 (*)    Albumin 2.9 (*)    GFR calc non Af Amer 39 (*)    GFR calc Af Amer 45 (*)    All other components within normal limits  CBG MONITORING, ED - Abnormal; Notable for the following:    Glucose-Capillary 293 (*)    All other components within normal limits  PROTIME-INR  URINALYSIS, ROUTINE W REFLEX MICROSCOPIC  I-STAT TROPOININ, ED   Imaging Review Dg Hip Complete Right  06/05/2013   CLINICAL DATA Right hip pain  EXAM RIGHT HIP - COMPLETE 2+ VIEW  COMPARISON None.  FINDINGS Mild degenerative changes of the right hip joint are noted. No acute fracture dislocation is noted. No gross soft tissue abnormality is seen.  IMPRESSION No acute abnormality noted.  SIGNATURE  Electronically Signed   By: Inez Catalina M.D.   On: 06/05/2013 14:31   Ct Head Wo Contrast  06/05/2013   CLINICAL DATA Altered mental status  EXAM CT HEAD WITHOUT CONTRAST  TECHNIQUE Contiguous axial images were obtained from the base of the skull through the vertex without intravenous contrast.  COMPARISON CT 05/19/2013  FINDINGS Generalized atrophy. Chronic microvascular ischemic changes. Negative for acute infarct. Negative for hemorrhage or mass.  Poorly developed mastoid tip on the right likely related to childhood infections. No acute bony change. Retention cyst right maxillary sinus.  IMPRESSION Atrophy and chronic microvascular ischemia.  No acute abnormality.  SIGNATURE  Electronically Signed   By: Franchot Gallo M.D.   On: 06/05/2013 14:55   Dg Chest Portable 1 View  06/05/2013   CLINICAL DATA Altered mental status.  EXAM PORTABLE CHEST - 1 VIEW  COMPARISON 05/19/2013  FINDINGS Bibasilar atelectasis. Heart is upper limits normal in size. No effusions. No acute bony abnormality.  IMPRESSION Bibasilar atelectasis.  SIGNATURE  Electronically Signed   By:  Rolm Baptise M.D.   On: 06/05/2013 13:39   Dg Knee Complete 4 Views Right  06/05/2013   CLINICAL DATA Right knee pain  EXAM RIGHT KNEE - COMPLETE 4+ VIEW  COMPARISON None.  FINDINGS Mild osteophytic changes are noted medially. Some meniscal calcifications are seen. No sizable joint  effusion is noted. Soft tissue swelling is noted. No fractures are seen.  IMPRESSION Chronic changes without acute abnormality.  SIGNATURE  Electronically Signed   By: Inez Catalina M.D.   On: 06/05/2013 14:32  All radiology studies independently viewed by me.      EKG Interpretation   Date/Time:  Wednesday June 05 2013 12:56:32 EDT Ventricular Rate:  65 PR Interval:  199 QRS Duration: 77 QT Interval:  434 QTC Calculation: 451 R Axis:   -26 Text Interpretation:  Sinus rhythm Nonspecific T wave abnormality baseline  artifact. No significant change was found Confirmed by [facility]  MD, TREY  ([id number]) on 06/05/2013 4:32:45 PM      MDM   Final diagnoses:  Altered mental status    80 yo male with history of dementia presenting with altered mental status.  Report from nursing home is that he is typically talkative but confused.  Today, not following commands and not talking more than one word answers.  His right leg appeared tender, so plain films of his knee and hip were obtained and unremarkable.  Lab testing and imaging also unremarkable. UA pending at time of consult to Internal Medicine for admission for AMS.      Houston Siren III, MD 06/06/13 [phone]

## 2013-06-05 NOTE — Progress Notes (Signed)
Patient trasfered from ED to 5W14 via bed; alert, disoriented, unable to verbalize his needs; moaning to touch; IV  in LAC running NS@50cc /hr; skin intact. No family member  present at that time. We will continue to monitor the resident.

## 2013-06-05 NOTE — ED Notes (Signed)
Transport to [facility]

## 2013-06-05 NOTE — ED Notes (Addendum)
Patient from [facility] and staff reported to EMS patient more lethargic than normal. LKW 1150. EMS reported patient staff stated patient history of dementia and normal to be able to have conversation. Currently responds to verbal stated name clearly only and able to follow minimal commands. Patient moans when repositioned in bed.

## 2013-06-06 DIAGNOSIS — N390 Urinary tract infection, site not specified: Principal | ICD-10-CM

## 2013-06-06 LAB — BASIC METABOLIC PANEL
BUN: 17 mg/dL (ref 6–23)
CO2: 25 meq/L (ref 19–32)
CREATININE: 1.2 mg/dL (ref 0.50–1.35)
Calcium: 9 mg/dL (ref 8.4–10.5)
Chloride: 98 mEq/L (ref 96–112)
GFR calc Af Amer: 57 mL/min — ABNORMAL LOW (ref >90)
GFR calc non Af Amer: 49 mL/min — ABNORMAL LOW (ref >90)
Glucose, Bld: 229 mg/dL — ABNORMAL HIGH (ref 70–99)
Potassium: 4 mEq/L (ref 3.7–5.3)
Sodium: 138 mEq/L (ref 137–147)

## 2013-06-06 LAB — GLUCOSE, CAPILLARY
GLUCOSE-CAPILLARY: 204 mg/dL — AB (ref 70–99)
GLUCOSE-CAPILLARY: 206 mg/dL — AB (ref 70–99)
Glucose-Capillary: 128 mg/dL — ABNORMAL HIGH (ref 70–99)
Glucose-Capillary: 140 mg/dL — ABNORMAL HIGH (ref 70–99)

## 2013-06-06 LAB — RPR: RPR Ser Ql: NONREACTIVE

## 2013-06-06 LAB — CBC
HEMATOCRIT: 36.5 % — AB (ref 39.0–52.0)
Hemoglobin: 12.2 g/dL — ABNORMAL LOW (ref 13.0–17.0)
MCH: 28.2 pg (ref 26.0–34.0)
MCHC: 33.4 g/dL (ref 30.0–36.0)
MCV: 84.5 fL (ref 78.0–100.0)
Platelets: 199 10*3/uL (ref 150–400)
RBC: 4.32 MIL/uL (ref 4.22–5.81)
RDW: 13.4 % (ref 11.5–15.5)
WBC: 12.1 10*3/uL — ABNORMAL HIGH (ref 4.0–10.5)

## 2013-06-06 LAB — TSH: TSH: 0.626 u[IU]/mL (ref 0.350–4.500)

## 2013-06-06 LAB — MRSA PCR SCREENING: MRSA BY PCR: NEGATIVE

## 2013-06-06 LAB — VITAMIN B12: VITAMIN B 12: 895 pg/mL (ref 211–911)

## 2013-06-06 MED ORDER — TAMSULOSIN HCL 0.4 MG PO CAPS
0.4000 mg | ORAL_CAPSULE | Freq: Every day | ORAL | Status: DC
  Administered 2013-06-08 – 2013-06-10 (×3): 0.4 mg via ORAL
  Filled 2013-06-06 (×5): qty 1

## 2013-06-06 MED ORDER — AMLODIPINE BESYLATE 5 MG PO TABS
5.0000 mg | ORAL_TABLET | Freq: Every day | ORAL | Status: DC
  Administered 2013-06-08 – 2013-06-10 (×3): 5 mg via ORAL
  Filled 2013-06-06 (×5): qty 1

## 2013-06-06 NOTE — Clinical Social Work Psychosocial (Signed)
CSW has reviewed BSW intern's psychosocial assessment and witnessed assessment by intern. CSW to assist with patient's DC back to ALF when appropriate.  Liz Beach, [place], [place], [phone]

## 2013-06-06 NOTE — Progress Notes (Signed)
[facility]                                                                                    Patient Demographics  [name], is a 80 y.o. male  MRN: [id number]   DOB - 23-Dec-1914 Admit Date - 06/05/2013 Outpatient Primary MD for the patient is Alesia Richards, MD  HPI  80 y.o. male with pmh significant for dementia, HTN, DM and CKD stage 3-4; transferred from SNF where he resides due to changes in mental status on 3./11. SNF reports baseline conversant and able to follow commands; today he is unable to follow commands and barely speaking and just moaning. UA suggests UTI   Assessment/Plan:  Encephalopathy acute:  - due to presumed UTI.  - underlying dementia most likely worsen by infection.  - CT head (-) for acute changes  -treat UTI and follow clinical course -TSH, B12, RPR WNL  UTI -c/w Rocephin-day 2 -await Urine culture-unfortunately not drawn on admission-drawn while already on IV antibiotics   DM:  -continue lantus and start SSI -CBG's stable -will stop metformin while inpatient  -low carb diet   Hypertensive Urgency -BP uncontrolled,c/wAtenolol, Benazepril, add Amlodipine -PRN hydralazine ordered   CHRONIC KIDNEY DISEASE UNSPECIFIED:  -stable and at baseline  -will monitor    Dementia:  -continue supportive care -currently calm and quiet-but very pleasantly confused  GERD -continue PPI  BPH -start Flomax  DVT Prophylaxis Heparin -  heparin  Family Communication:   None at bedside  Code Status:  DNR Likely DC: to  SNF when appropriate  Condition: Time spent in minutes : 45   Physical Exam  Vitals  Blood pressure 204/76, pulse 61, temperature 99.7 F (37.6 C), temperature source Oral, resp. rate 20, height 5' 3.5" (1.613 m), weight 75.161 kg (165 lb 11.2 oz), SpO2 97.00%.  General: Appears calm, afebrile, unable to follow commands Eyes: PERRL, ENT: dry MM, no erythema or exudates; poor dentition; no drainage out of ears or  nostrils  Neck: no LAD, masses or thyromegaly, no JVD  Cardiovascular: RRR, no m/r/g.  Abdomen: tender to palpation, feels distended, + bowel sounds Respiratory: CTA bilaterally, no w/r/r. Normal respiratory efforts.  Skin: no rash or lesions on exam  Extremities: pain to palpation around knee, 2+ edema in RLE, mild LLE edema. Neurologic: unable to follow commands; moves four limbs spontaneously  Skin:  No Cyanosis, Normal Skin Turgor, No Skin Rash or Bruise.   Data Review  CBC  Recent Labs Lab 06/05/13 1302 06/06/13 0720  WBC 10.0 12.1*  HGB 13.3 12.2*  HCT 46.3 36.5*  PLT 155 199  MCV 98.3 84.5  MCH 28.2 28.2  MCHC 28.7* 33.4  RDW 14.5 13.4   ------------------------------------------------------------------------------------------------------------------  Chemistries   Recent Labs Lab 06/05/13 1302 06/06/13 0720  NA 139 138  K 4.4 4.0  CL 98 98  CO2 28 25  GLUCOSE 312* 229*  BUN 24* 17  CREATININE 1.46* 1.20  CALCIUM 9.3 9.0  AST 13  --   ALT 12  --   ALKPHOS 105  --   BILITOT 0.4  --    ------------------------------------------------------------------------------------------------------------------ estimated creatinine clearance is 33.1 ml/min (  by C-G formula based on Cr of 1.2). ------------------------------------------------------------------------------------------------------------------  Recent Labs  06/05/13 2100  TSH 0.626     Coagulation profile  Recent Labs Lab 06/05/13 1302  INR 1.05    ---------------------------------------------------------------------------------------------------------------  Urinalysis    Component Value Date/Time   COLORURINE YELLOW 06/05/2013 1654   APPEARANCEUR HAZY* 06/05/2013 1654   LABSPEC 1.019 06/05/2013 1654   PHURINE 8.0 06/05/2013 1654   GLUCOSEU 250* 06/05/2013 1654   HGBUR LARGE* 06/05/2013 1654   BILIRUBINUR NEGATIVE 06/05/2013 1654   KETONESUR NEGATIVE 06/05/2013 1654   PROTEINUR 100*  06/05/2013 1654   UROBILINOGEN 0.2 06/05/2013 1654   NITRITE NEGATIVE 06/05/2013 1654   LEUKOCYTESUR SMALL* 06/05/2013 1654    ----------------------------------------------------------------------------------------------------------------  Past Medical History  Diagnosis Date  . Dementia   . Diabetes mellitus   . Hypertension   . Cancer     prostrate  . Cirrhosis   . GERD (gastroesophageal reflux disease)       Past Surgical History  Procedure Laterality Date  . Appendectomy      Chief Complaint  Patient presents with  . Altered Mental Status     Social History History  Substance Use Topics  . Smoking status: Former Smoker    Quit date: 03/29/1971  . Smokeless tobacco: Not on file  . Alcohol Use: No     Family History Family History  Problem Relation Age of Onset  . Diabetes Mother      Prior to Admission medications   Medication Sig Start Date End Date Taking? Authorizing Provider  acetaminophen (TYLENOL) 650 MG CR tablet Take 650 mg by mouth every 4 (four) hours as needed for pain.   Yes Historical Provider, MD  aspirin EC 81 MG tablet Take 81 mg by mouth daily.    Yes Historical Provider, MD  atenolol (TENORMIN) 100 MG tablet Take 100 mg by mouth every evening.  10/30/11  Yes Historical Provider, MD  B Complex Vitamins (VITAMIN B COMPLEX) TABS Take 1 tablet by mouth daily.    Yes Historical Provider, MD  benazepril (LOTENSIN) 20 MG tablet Take 20 mg by mouth daily.    Yes Historical Provider, MD  Cholecalciferol (VITAMIN D-3) 5000 UNITS TABS Take 1 tablet by mouth daily.   Yes Historical Provider, MD  gabapentin (NEURONTIN) 300 MG capsule Take 300 mg by mouth at bedtime.   Yes Historical Provider, MD  LANTUS SOLOSTAR 100 UNIT/ML injection Inject 15 Units into the skin every morning.  10/18/11  Yes Historical Provider, MD  Magnesium 500 MG TABS Take 500 mg by mouth 3 (three) times daily.   Yes Historical Provider, MD  metFORMIN (GLUCOPHAGE-XR) 500 MG 24 hr tablet  Take 1,000 mg by mouth every evening.   Yes Historical Provider, MD  white petrolatum (VASELINE) GEL Apply 1 application topically at bedtime.   Yes Historical Provider, MD    Allergies  Allergen Reactions  . Allegra [Fexofenadine Hcl] Other (See Comments)    LBP    Imaging results:   Dg Chest 2 View  05/19/2013   CLINICAL DATA:  Left foot pain.  Confusion and combative  EXAM: CHEST  2 VIEW  COMPARISON:  03/28/2013  FINDINGS: Normal heart size. No pleural effusion or edema. Lung volumes appear low. Mild spondylosis identified within the thoracic spine.  IMPRESSION: 1. Low lung volumes.   Electronically Signed   By: Kerby Moors M.D.   On: 05/19/2013 15:17   Dg Hip Complete Right  06/05/2013   CLINICAL DATA Right hip pain  EXAM RIGHT HIP - COMPLETE 2+ VIEW  COMPARISON None.  FINDINGS Mild degenerative changes of the right hip joint are noted. No acute fracture dislocation is noted. No gross soft tissue abnormality is seen.  IMPRESSION No acute abnormality noted.  SIGNATURE  Electronically Signed   By: Inez Catalina M.D.   On: 06/05/2013 14:31   Ct Head Wo Contrast  06/05/2013   CLINICAL DATA Altered mental status  EXAM CT HEAD WITHOUT CONTRAST  TECHNIQUE Contiguous axial images were obtained from the base of the skull through the vertex without intravenous contrast.  COMPARISON CT 05/19/2013  FINDINGS Generalized atrophy. Chronic microvascular ischemic changes. Negative for acute infarct. Negative for hemorrhage or mass.  Poorly developed mastoid tip on the right likely related to childhood infections. No acute bony change. Retention cyst right maxillary sinus.  IMPRESSION Atrophy and chronic microvascular ischemia.  No acute abnormality.  SIGNATURE  Electronically Signed   By: Franchot Gallo M.D.   On: 06/05/2013 14:55   Ct Head Wo Contrast  05/19/2013   CLINICAL DATA:  [place] at nursing home, history dementia, diabetes, hypertension, prostate cancer  EXAM: CT HEAD WITHOUT CONTRAST  CT CERVICAL  SPINE WITHOUT CONTRAST  TECHNIQUE: Multidetector CT imaging of the head and cervical spine was performed following the standard protocol without intravenous contrast. Multiplanar CT image reconstructions of the cervical spine were also generated.  COMPARISON:  CT head 12/26/2011  FINDINGS: CT HEAD FINDINGS  Generalized atrophy.  Normal ventricular morphology.  No midline shift or mass effect.  Small vessel chronic ischemic changes of deep cerebral white matter.  No intracranial hemorrhage, mass lesion, or acute infarction.  Mucosal retention cyst right maxillary sinus.  Visualized paranasal sinuses and mastoid air cells otherwise clear.  Bones unremarkable.  Minimal atherosclerotic calcification carotid siphons.  CT CERVICAL SPINE FINDINGS  Prevertebral soft tissues normal thickness.  Osseous mineralization normal.  Scattered endplate spur formation and disc space narrowing throughout cervical spine.  Scattered facet degenerative changes bilaterally with probable ankylosis of the left C3-C4 facet joint.  Vertebral body heights maintained without fracture or subluxation.  Visualized skullbase intact.  Questionable subtle lucent lesion within the spinous process of C7, cannot exclude lytic/destructive lesion.  Lung apices clear.  IMPRESSION: No acute intracranial abnormalities.  Atrophy with minimal small vessel chronic ischemic changes of deep cerebral white matter.  Scattered degenerative disc and facet disease to changes throughout cervical spine.  No evidence acute cervical spine injury.  Questionable lucent process within the C7 spinous process, nonspecific, cannot exclude a lytic metastatic lesion ; this would be atypical for prostate cancer which typically causes sclerotic metastases.   Electronically Signed   By: Lavonia Dana M.D.   On: 05/19/2013 15:02   Ct Cervical Spine Wo Contrast  05/19/2013   CLINICAL DATA:  [facility] at nursing home, history dementia, diabetes, hypertension, prostate cancer  EXAM: CT HEAD  WITHOUT CONTRAST  CT CERVICAL SPINE WITHOUT CONTRAST  TECHNIQUE: Multidetector CT imaging of the head and cervical spine was performed following the standard protocol without intravenous contrast. Multiplanar CT image reconstructions of the cervical spine were also generated.  COMPARISON:  CT head 12/26/2011  FINDINGS: CT HEAD FINDINGS  Generalized atrophy.  Normal ventricular morphology.  No midline shift or mass effect.  Small vessel chronic ischemic changes of deep cerebral white matter.  No intracranial hemorrhage, mass lesion, or acute infarction.  Mucosal retention cyst right maxillary sinus.  Visualized paranasal sinuses and mastoid air cells otherwise clear.  Bones unremarkable.  Minimal  atherosclerotic calcification carotid siphons.  CT CERVICAL SPINE FINDINGS  Prevertebral soft tissues normal thickness.  Osseous mineralization normal.  Scattered endplate spur formation and disc space narrowing throughout cervical spine.  Scattered facet degenerative changes bilaterally with probable ankylosis of the left C3-C4 facet joint.  Vertebral body heights maintained without fracture or subluxation.  Visualized skullbase intact.  Questionable subtle lucent lesion within the spinous process of C7, cannot exclude lytic/destructive lesion.  Lung apices clear.  IMPRESSION: No acute intracranial abnormalities.  Atrophy with minimal small vessel chronic ischemic changes of deep cerebral white matter.  Scattered degenerative disc and facet disease to changes throughout cervical spine.  No evidence acute cervical spine injury.  Questionable lucent process within the C7 spinous process, nonspecific, cannot exclude a lytic metastatic lesion ; this would be atypical for prostate cancer which typically causes sclerotic metastases.   Electronically Signed   By: Lavonia Dana M.D.   On: 05/19/2013 15:02   Dg Chest Portable 1 View  06/05/2013   CLINICAL DATA Altered mental status.  EXAM PORTABLE CHEST - 1 VIEW  COMPARISON  05/19/2013  FINDINGS Bibasilar atelectasis. Heart is upper limits normal in size. No effusions. No acute bony abnormality.  IMPRESSION Bibasilar atelectasis.  SIGNATURE  Electronically Signed   By: Rolm Baptise M.D.   On: 06/05/2013 13:39   Dg Knee Complete 4 Views Right  06/05/2013   CLINICAL DATA Right knee pain  EXAM RIGHT KNEE - COMPLETE 4+ VIEW  COMPARISON None.  FINDINGS Mild osteophytic changes are noted medially. Some meniscal calcifications are seen. No sizable joint effusion is noted. Soft tissue swelling is noted. No fractures are seen.  IMPRESSION Chronic changes without acute abnormality.  SIGNATURE  Electronically Signed   By: Inez Catalina M.D.   On: 06/05/2013 14:32   Dg Foot Complete Left  05/19/2013   CLINICAL DATA:  Fall, left foot pain  EXAM: LEFT FOOT - COMPLETE 3+ VIEW  COMPARISON:  None.  FINDINGS: Bones are osteopenic. Nondisplaced fracture of the left fifth toe proximal phalanx noted. Degenerative changes of the left first MTP joint. Metatarsals appear intact. No definite soft tissue abnormality.  IMPRESSION: Nondisplaced fracture left fifth toe proximal phalanx.   Electronically Signed   By: Daryll Brod M.D.   On: 05/19/2013 15:21    Assessment & Plan  Principal Problem:   Encephalopathy acute Active Problems:   DM   HYPERTENSION   CHRONIC KIDNEY DISEASE UNSPECIFIED   Dementia   GERD (gastroesophageal reflux disease)   Acute encephalopathy   Docia Chuck PA-S on 06/06/2013 at 9:02 AM  [place]  [phone]  Attending Patient is seen and examined the, agree with the above assessment and plan. Admitted with suspected metabolic encephalopathy from UTI. Seems to have been clinically improve, await urine cultures. Continue Rocephin. Obtain physical therapy evaluation. Pressure uncontrolled, has started amlodipine and Flomax. Continue with current care, anticipate another one or 2 days of continued hospitalization before discharge back to  SNF.  Nena Alexander MD

## 2013-06-06 NOTE — Progress Notes (Signed)
Patient not following commands and spitting out food and pills. Medications held r/t patient not cooperating. Joslyn Hy, MSN, RN, [employer]

## 2013-06-06 NOTE — Progress Notes (Signed)
Utilization review completed.  

## 2013-06-06 NOTE — Plan of Care (Signed)
Problem: Phase I Progression Outcomes Goal: Voiding-avoid urinary catheter unless indicated Outcome: Not Met (add Reason) In-and-out catheter used. Patient unable to void retained urine.

## 2013-06-06 NOTE — Clinical Social Work Psychosocial (Signed)
Clinical Social Work Department BRIEF PSYCHOSOCIAL ASSESSMENT 06/06/2013  Patient:  [name],[name]     Account Number:  [id number]     Admit date:  06/05/2013  Clinical Social Worker:  Lovey Newcomer  Date/Time:  06/06/2013 03:01 PM  Referred by:  Physician  Date Referred:  06/06/2013 Referred for  ALF Placement   Other Referral:   Interview type:  Family Other interview type:   Patient not alert and oriented at time of assessment. Patinet's HPOA [name] interviewed.    PSYCHOSOCIAL DATA Living Status:  FACILITY Admitted from facility:  [name] of [facility] Level of care:  Assisted Living Primary support name:  [name] Primary support relationship to patient:  FAMILY Degree of support available:   Pt has support from family.    CURRENT CONCERNS Current Concerns  Post-Acute Placement   Other Concerns:    SOCIAL WORK ASSESSMENT / PLAN BSW Intern called family members of Pt due to the fact that the Pt was not alert and oriented. BSW called [name] [facility]) [phone] and Mechele Claude [phone]. [name] (niece) was not available but BSW student was able to speak with [name] (nephew).    Pt's nephew [name] agree to speak with BSW student regarding the Pt. [name] states that Pt have been at [facility] for 5-6 months and when Pt is d/c from [facility], they want the Pt to go back to [place]. [name] states that he and his mother do not have any issues with [facility].    Pt's nephew states that before Pt went to [place], Pt was living in his home.  Pt  had 24 hour care and Pt was maybe by himself for 1 hour out of the day.    *Update*  While writing Pt's assessment Pt's niece [name] called BSW student back and agree with everything the Pt's nephew told me. [name] told BSW intern that Pt was walking fine with  a walker until Pt fell a couple of weeks ago, she states that Pt is able to speak one or two words, can answer yes or no  questions and is very hard at hearing. [name] also states that Pt attempts to wash or shave himself, but needs assistance.   Assessment/plan status:  Psychosocial Support/Ongoing Assessment of Needs Other assessment/ plan:   Pt's information will be sent to [facility].   Information/referral to community resources:   CSW contact information given to family.    PATIENT'S/FAMILY'S RESPONSE TO PLAN OF CARE: Pt's family was very Patent attorney of services given from [employer]. Pt's niece states that she doesn't have any problems with [name]  and would like for Pt to go there after d/c.  Pt niece states that she would like for the Pt to get PT from the facility and would like for BSW Intern or CSW to keep her informed of Pt d/c planning. [name] was concerned about how her uncle is doing now in the hospital and was happy to hear and that he is being taken care of.      [place] BSW-Intern [facility] [phone]

## 2013-06-07 ENCOUNTER — Inpatient Hospital Stay (HOSPITAL_COMMUNITY): Payer: Medicare Other

## 2013-06-07 LAB — BASIC METABOLIC PANEL
BUN: 20 mg/dL (ref 6–23)
CO2: 24 mEq/L (ref 19–32)
Calcium: 8.8 mg/dL (ref 8.4–10.5)
Chloride: 101 mEq/L (ref 96–112)
Creatinine, Ser: 1.48 mg/dL — ABNORMAL HIGH (ref 0.50–1.35)
GFR calc Af Amer: 44 mL/min — ABNORMAL LOW (ref >90)
GFR, EST NON AFRICAN AMERICAN: 38 mL/min — AB (ref >90)
Glucose, Bld: 125 mg/dL — ABNORMAL HIGH (ref 70–99)
Potassium: 3.7 mEq/L (ref 3.7–5.3)
Sodium: 140 mEq/L (ref 137–147)

## 2013-06-07 LAB — URINE CULTURE
Colony Count: NO GROWTH
Culture: NO GROWTH

## 2013-06-07 LAB — CBC
HEMATOCRIT: 35.6 % — AB (ref 39.0–52.0)
Hemoglobin: 12 g/dL — ABNORMAL LOW (ref 13.0–17.0)
MCH: 28.2 pg (ref 26.0–34.0)
MCHC: 33.7 g/dL (ref 30.0–36.0)
MCV: 83.8 fL (ref 78.0–100.0)
Platelets: 226 10*3/uL (ref 150–400)
RBC: 4.25 MIL/uL (ref 4.22–5.81)
RDW: 13.8 % (ref 11.5–15.5)
WBC: 11.3 10*3/uL — ABNORMAL HIGH (ref 4.0–10.5)

## 2013-06-07 LAB — GLUCOSE, CAPILLARY
Glucose-Capillary: 117 mg/dL — ABNORMAL HIGH (ref 70–99)
Glucose-Capillary: 205 mg/dL — ABNORMAL HIGH (ref 70–99)
Glucose-Capillary: 204 mg/dL — ABNORMAL HIGH (ref 70–99)
Glucose-Capillary: 155 mg/dL — ABNORMAL HIGH (ref 70–99)

## 2013-06-07 MED ORDER — SODIUM CHLORIDE 0.9 % IV SOLN
INTRAVENOUS | Status: DC
  Administered 2013-06-07 – 2013-06-08 (×2): via INTRAVENOUS

## 2013-06-07 NOTE — Procedures (Addendum)
Objective Swallowing Evaluation: Modified Barium Swallowing Study  Patient Details  Name: [name] MRN: [id number] Date of Birth: 07-02-1914  Today's Date: 06/07/2013 Time: 3244-0102 SLP Time Calculation (min): 30 min  Past Medical History:  Past Medical History  Diagnosis Date  . Dementia   . Diabetes mellitus   . Hypertension   . Cancer     prostrate  . Cirrhosis   . GERD (gastroesophageal reflux disease)    Past Surgical History:  Past Surgical History  Procedure Laterality Date  . Appendectomy     HPI:  80 y.o. male with pmh significant for dementia, HTN, DM and CKD stage 3-4; transferred from SNF where he resides due to changes in mental status on 3./11. SNF reports baseline conversant and able to follow commands; Probably UTI. Family member reports pt typically able to eat soft foods and thin liquids. She observed coughing and choking, wet vocal quality with breakfast.      Assessment / Plan / Recommendation Clinical Impression  Dysphagia Diagnosis: Moderate oral phase dysphagia;Moderate pharyngeal phase dysphagia Clinical impression: Pt presents with a moderate oral and oropharyngeal dysphagia impaired by cognitive decline. Pt has reduced awreness of boluses and insufficient attention to form and fully transit thin and solids boluses, leaving oral residuals after the swallow. Swallow initiation is severely delayed and as boluses sit in pharynx prior to the swallow, the pt penetrates or aspirates. Even when the swallow is triggered, oral residuals fall to pharynx and are also penetrated or aspirated silently. SLP made max attempts to cue swallow response (verbal/tactile, dry spoon, next bolus). Pt could not respond to cues. Per the pts POA, his responsiveness to PO has declined over the past week. He does not have a history of dysphagia, which is consistent with the finding of adequate strength but cognitive decline. Recommend keeping pt NPO for 1-2 days as pt gets treatment.  Any diet would lead to high risk of aspiration, but will allow meds crushed in puree if needed. Hopeful that responsiveness will improve. POA in agreement with plan, she states pt would not want even a temporary feeding tube. SLP to f/u tomorrow.     Treatment Recommendation  Therapy as outlined in treatment plan below    Diet Recommendation NPO except meds   Medication Administration: Crushed with puree Compensations: Multiple dry swallows after each bite/sip Postural Changes and/or Swallow Maneuvers: Seated upright 90 degrees    Other  Recommendations Recommended Consults: MBS Oral Care Recommendations: Oral care Q4 per protocol   Follow Up Recommendations  Skilled Nursing facility    Frequency and Duration min 2x/week  2 weeks   Pertinent Vitals/Pain NA    SLP Swallow Goals     General HPI: 80 y.o. male with pmh significant for dementia, HTN, DM and CKD stage 3-4; transferred from SNF where he resides due to changes in mental status on 3./11. SNF reports baseline conversant and able to follow commands; Probably UTI. Family member reports pt typically able to eat soft foods and thin liquids. She observed coughing and choking, wet vocal quality with breakfast.  Type of Study: Modified Barium Swallowing Study Reason for Referral: Objectively evaluate swallowing function Previous Swallow Assessment: none Diet Prior to this Study: NPO Temperature Spikes Noted: No Respiratory Status: Room air History of Recent Intubation: No Behavior/Cognition: Alert;Doesn't follow directions Oral Cavity - Dentition: Adequate natural dentition Oral Motor / Sensory Function: Within functional limits Self-Feeding Abilities: Total assist Patient Positioning: Upright in chair Baseline Vocal Quality: Wet Volitional Cough: Cognitively  unable to elicit Volitional Swallow: Unable to elicit Anatomy: Within functional limits Pharyngeal Secretions: Not observed secondary MBS    Reason for Referral  Objectively evaluate swallowing function   Oral Phase Oral Preparation/Oral Phase Oral Phase: Impaired Oral - Nectar Oral - Nectar Cup: Weak lingual manipulation;Reduced posterior propulsion;Lingual/palatal residue;Delayed oral transit Oral - Nectar Straw: Weak lingual manipulation;Reduced posterior propulsion;Lingual/palatal residue;Delayed oral transit Oral - Thin Oral - Thin Cup: Weak lingual manipulation;Reduced posterior propulsion;Lingual/palatal residue;Delayed oral transit Oral - Thin Straw: Weak lingual manipulation;Reduced posterior propulsion;Lingual/palatal residue;Delayed oral transit Oral - Solids Oral - Puree: Weak lingual manipulation;Reduced posterior propulsion;Lingual/palatal residue;Delayed oral transit Oral - Mechanical Soft: Weak lingual manipulation;Reduced posterior propulsion;Lingual/palatal residue;Delayed oral transit;Impaired mastication   Pharyngeal Phase Pharyngeal Phase Pharyngeal Phase: Impaired Pharyngeal - Nectar Pharyngeal - Nectar Cup: Delayed swallow initiation;Premature spillage to pyriform sinuses;Penetration/Aspiration during swallow;Pharyngeal residue - valleculae;Pharyngeal residue - pyriform sinuses Penetration/Aspiration details (nectar cup): Material enters airway, passes BELOW cords without attempt by patient to eject out (silent aspiration);Material does not enter airway Pharyngeal - Nectar Straw: Delayed swallow initiation;Premature spillage to pyriform sinuses;Penetration/Aspiration during swallow;Pharyngeal residue - valleculae;Pharyngeal residue - pyriform sinuses Penetration/Aspiration details (nectar straw): Material does not enter airway;Material enters airway, passes BELOW cords without attempt by patient to eject out (silent aspiration) Pharyngeal - Thin Pharyngeal - Thin Cup: Delayed swallow initiation;Premature spillage to pyriform sinuses;Penetration/Aspiration during swallow;Pharyngeal residue - valleculae;Pharyngeal residue - pyriform  sinuses Penetration/Aspiration details (thin cup): Material does not enter airway;Material enters airway, passes BELOW cords without attempt by patient to eject out (silent aspiration) Pharyngeal - Thin Straw: Delayed swallow initiation;Premature spillage to pyriform sinuses;Penetration/Aspiration during swallow;Pharyngeal residue - valleculae;Pharyngeal residue - pyriform sinuses Penetration/Aspiration details (thin straw): Material does not enter airway;Material enters airway, passes BELOW cords without attempt by patient to eject out (silent aspiration) Pharyngeal - Solids Pharyngeal - Puree: Delayed swallow initiation;Premature spillage to pyriform sinuses;Pharyngeal residue - valleculae;Pharyngeal residue - pyriform sinuses Penetration/Aspiration details (puree): Material does not enter airway Pharyngeal - Mechanical Soft: Delayed swallow initiation;Premature spillage to valleculae;Pharyngeal residue - valleculae  Cervical Esophageal Phase    GO             Herbie Baltimore, MA CCC-SLP [phone]  Lynann Beaver 06/07/2013, 12:17 PM

## 2013-06-07 NOTE — Progress Notes (Signed)
PATIENT DETAILS Name: [name] Age: 80 y.o. Sex: male Date of Birth: 1915-02-07 Admit Date: 06/05/2013 Admitting Physician No admitting provider for patient encounter. [id number] DAVID, MD  Subjective: Very lethargic, able to follow some commands.  Assessment/Plan: Encephalopathy acute:  - due to presumed UTI.  - underlying dementia most likely worsen by infection.  - CT head (-) for acute changes  -  treat UTI with Rocephin and follow clinical course- unfortunately continues to deteriorate. See discussion below -TSH, B12, RPR WNL   UTI  -c/w Rocephin-day 3 -await Urine culture-unfortunately not drawn on admission-drawn while already on IV antibiotics   DM:  -continue lantus and start SSI -CBG's stable  -will stop metformin while inpatient  -low carb diet   Hypertensive Urgency  -BP uncontrolled but better than what it was a few days ago,c/wAtenolol, Benazepril, and Amlodipine  -PRN hydralazine ordered   Dysphagia - Likely worsened by acute illness and encephalopathy, suspect some amount of chronic dysphagia given history of dementia and advanced age. - Keep n.p.o. for now, speech therapy recommending modified barium swallow.  CHRONIC KIDNEY DISEASE UNSPECIFIED:  -stable and at baseline  -will monitor   Dementia:  -continue supportive care  -currently calm lethargic  GERD  -continue PPI   BPH  - Continue with Flomax  Ethics/palliative care - Spoke with [name] ([place]) [phone] patient's niece and healthcare power of attorney, poor prognosis, declining condition discussed in detail. DO NOT RESUSCITATE reaffirmed. Explained that we will try with IV fluids, IV antibiotics for another day or so, if no improvement may need to transition to comfort measures. She is agreeable. We'll not plan on the form of artificial feeding given overall poor prognosis. Have consulted palliative care.   Disposition: Remain inpatient  DVT  Prophylaxis: Prophylactic Heparin  Code Status: DNR  Family Communication [name] [facility]) [phone]   Procedures:  None  CONSULTS:  Palliative Care  Time spent 40 minutes-which includes 50% of the time with face-to-face with patient/ family and coordinating care related to the above assessment and plan.  MEDICATIONS: Scheduled Meds: . amLODipine  5 mg Oral Daily  . aspirin EC  81 mg Oral Daily  . atenolol  100 mg Oral QPM  . benazepril  20 mg Oral Daily  . cefTRIAXone (ROCEPHIN)  IV  1 g Intravenous Q24H  . cholecalciferol  1,000 Units Oral Daily  . vitamin B-12  500 mcg Oral Daily  . gabapentin  100 mg Oral QHS  . heparin  5,000 Units Subcutaneous 3 times per day  . insulin aspart  0-9 Units Subcutaneous TID WC  . insulin glargine  15 Units Subcutaneous Daily  . magnesium oxide  400 mg Oral TID  . pantoprazole  40 mg Oral Q1200  . tamsulosin  0.4 mg Oral Daily   Continuous Infusions: . sodium chloride     PRN Meds:.acetaminophen, hydrALAZINE, ondansetron (ZOFRAN) IV, ondansetron  Antibiotics: Anti-infectives   Start     Dose/Rate Route Frequency Ordered Stop   06/05/13 2000  cefTRIAXone (ROCEPHIN) 1 g in dextrose 5 % 50 mL IVPB     1 g 100 mL/hr over 30 Minutes Intravenous Every 24 hours 06/05/13 1834        PHYSICAL EXAM: Vital signs in last 24 hours: Filed Vitals:   06/07/13 0422 06/07/13 0430 06/07/13 0536 06/07/13 0607  BP:   182/61 168/65  Pulse:   63   Temp: 100.5 F (38.1 C) 100.1 F (37.8  C) 100.2 F (37.9 C)   TempSrc: Axillary Axillary Axillary   Resp:   20   Height:      Weight:      SpO2:   97%     Weight change:  Filed Weights   06/05/13 2214  Weight: 75.161 kg (165 lb 11.2 oz)   Body mass index is 28.89 kg/(m^2).   Gen Exam: Very lethargic, follows some commands. Neck: Supple, No JVD.   Chest: B/L Clear.  CVS: S1 S2 Regular, no murmurs.  Abdomen: soft, BS +, non tender, non distended.  Extremities: no edema, lower  extremities warm to touch. Neurologic: Non Focal.   Skin: No Rash.   Wounds: N/A.    Intake/Output from previous day:  Intake/Output Summary (Last 24 hours) at 06/07/13 1054 Last data filed at 06/07/13 0534  Gross per 24 hour  Intake    140 ml  Output    750 ml  Net   -610 ml     LAB RESULTS: CBC  Recent Labs Lab 06/05/13 1302 06/06/13 0720 06/07/13 0657  WBC 10.0 12.1* 11.3*  HGB 13.3 12.2* 12.0*  HCT 46.3 36.5* 35.6*  PLT 155 199 226  MCV 98.3 84.5 83.8  MCH 28.2 28.2 28.2  MCHC 28.7* 33.4 33.7  RDW 14.5 13.4 13.8    Chemistries   Recent Labs Lab 06/05/13 1302 06/06/13 0720 06/07/13 0657  NA 139 138 140  K 4.4 4.0 3.7  CL 98 98 101  CO2 28 25 24   GLUCOSE 312* 229* 125*  BUN 24* 17 20  CREATININE 1.46* 1.20 1.48*  CALCIUM 9.3 9.0 8.8    CBG:  Recent Labs Lab 06/06/13 0743 06/06/13 1157 06/06/13 1658 06/06/13 2208 06/07/13 0745  GLUCAP 204* 206* 128* 140* 117*    GFR Estimated Creatinine Clearance: 26.8 ml/min (by C-G formula based on Cr of 1.48).  Coagulation profile  Recent Labs Lab 06/05/13 1302  INR 1.05    Cardiac Enzymes No results found for this basename: CK, CKMB, TROPONINI, MYOGLOBIN,  in the last 168 hours  No components found with this basename: POCBNP,  No results found for this basename: DDIMER,  in the last 72 hours No results found for this basename: HGBA1C,  in the last 72 hours No results found for this basename: CHOL, HDL, LDLCALC, TRIG, CHOLHDL, LDLDIRECT,  in the last 72 hours  Recent Labs  06/05/13 2100  TSH 0.626    Recent Labs  06/05/13 2100  VITAMINB12 895   No results found for this basename: LIPASE, AMYLASE,  in the last 72 hours  Urine Studies No results found for this basename: UACOL, UAPR, USPG, UPH, UTP, UGL, UKET, UBIL, UHGB, UNIT, UROB, ULEU, UEPI, UWBC, URBC, UBAC, CAST, CRYS, UCOM, BILUA,  in the last 72 hours  MICROBIOLOGY: Recent Results (from the past 240 hour(s))  MRSA PCR  SCREENING     Status: None   Collection Time    06/06/13 12:18 AM      Result Value Ref Range Status   MRSA by PCR NEGATIVE  NEGATIVE Final   Comment:            The GeneXpert MRSA Assay (FDA     approved for NASAL specimens     only), is one component of a     comprehensive MRSA colonization     surveillance program. It is not     intended to diagnose MRSA     infection nor to guide or  monitor treatment for     MRSA infections.    RADIOLOGY STUDIES/RESULTS: Dg Chest 2 View  05/19/2013   CLINICAL DATA:  Left foot pain.  Confusion and combative  EXAM: CHEST  2 VIEW  COMPARISON:  03/28/2013  FINDINGS: Normal heart size. No pleural effusion or edema. Lung volumes appear low. Mild spondylosis identified within the thoracic spine.  IMPRESSION: 1. Low lung volumes.   Electronically Signed   By: Kerby Moors M.D.   On: 05/19/2013 15:17   Dg Hip Complete Right  06/05/2013   CLINICAL DATA Right hip pain  EXAM RIGHT HIP - COMPLETE 2+ VIEW  COMPARISON None.  FINDINGS Mild degenerative changes of the right hip joint are noted. No acute fracture dislocation is noted. No gross soft tissue abnormality is seen.  IMPRESSION No acute abnormality noted.  SIGNATURE  Electronically Signed   By: Inez Catalina M.D.   On: 06/05/2013 14:31   Ct Head Wo Contrast  06/05/2013   CLINICAL DATA Altered mental status  EXAM CT HEAD WITHOUT CONTRAST  TECHNIQUE Contiguous axial images were obtained from the base of the skull through the vertex without intravenous contrast.  COMPARISON CT 05/19/2013  FINDINGS Generalized atrophy. Chronic microvascular ischemic changes. Negative for acute infarct. Negative for hemorrhage or mass.  Poorly developed mastoid tip on the right likely related to childhood infections. No acute bony change. Retention cyst right maxillary sinus.  IMPRESSION Atrophy and chronic microvascular ischemia.  No acute abnormality.  SIGNATURE  Electronically Signed   By: Franchot Gallo M.D.   On: 06/05/2013  14:55   Ct Head Wo Contrast  05/19/2013   CLINICAL DATA:  [place] at nursing home, history dementia, diabetes, hypertension, prostate cancer  EXAM: CT HEAD WITHOUT CONTRAST  CT CERVICAL SPINE WITHOUT CONTRAST  TECHNIQUE: Multidetector CT imaging of the head and cervical spine was performed following the standard protocol without intravenous contrast. Multiplanar CT image reconstructions of the cervical spine were also generated.  COMPARISON:  CT head 12/26/2011  FINDINGS: CT HEAD FINDINGS  Generalized atrophy.  Normal ventricular morphology.  No midline shift or mass effect.  Small vessel chronic ischemic changes of deep cerebral white matter.  No intracranial hemorrhage, mass lesion, or acute infarction.  Mucosal retention cyst right maxillary sinus.  Visualized paranasal sinuses and mastoid air cells otherwise clear.  Bones unremarkable.  Minimal atherosclerotic calcification carotid siphons.  CT CERVICAL SPINE FINDINGS  Prevertebral soft tissues normal thickness.  Osseous mineralization normal.  Scattered endplate spur formation and disc space narrowing throughout cervical spine.  Scattered facet degenerative changes bilaterally with probable ankylosis of the left C3-C4 facet joint.  Vertebral body heights maintained without fracture or subluxation.  Visualized skullbase intact.  Questionable subtle lucent lesion within the spinous process of C7, cannot exclude lytic/destructive lesion.  Lung apices clear.  IMPRESSION: No acute intracranial abnormalities.  Atrophy with minimal small vessel chronic ischemic changes of deep cerebral white matter.  Scattered degenerative disc and facet disease to changes throughout cervical spine.  No evidence acute cervical spine injury.  Questionable lucent process within the C7 spinous process, nonspecific, cannot exclude a lytic metastatic lesion ; this would be atypical for prostate cancer which typically causes sclerotic metastases.   Electronically Signed   By: Lavonia Dana  M.D.   On: 05/19/2013 15:02   Ct Cervical Spine Wo Contrast  05/19/2013   CLINICAL DATA:  [facility] at nursing home, history dementia, diabetes, hypertension, prostate cancer  EXAM: CT HEAD WITHOUT CONTRAST  CT CERVICAL  SPINE WITHOUT CONTRAST  TECHNIQUE: Multidetector CT imaging of the head and cervical spine was performed following the standard protocol without intravenous contrast. Multiplanar CT image reconstructions of the cervical spine were also generated.  COMPARISON:  CT head 12/26/2011  FINDINGS: CT HEAD FINDINGS  Generalized atrophy.  Normal ventricular morphology.  No midline shift or mass effect.  Small vessel chronic ischemic changes of deep cerebral white matter.  No intracranial hemorrhage, mass lesion, or acute infarction.  Mucosal retention cyst right maxillary sinus.  Visualized paranasal sinuses and mastoid air cells otherwise clear.  Bones unremarkable.  Minimal atherosclerotic calcification carotid siphons.  CT CERVICAL SPINE FINDINGS  Prevertebral soft tissues normal thickness.  Osseous mineralization normal.  Scattered endplate spur formation and disc space narrowing throughout cervical spine.  Scattered facet degenerative changes bilaterally with probable ankylosis of the left C3-C4 facet joint.  Vertebral body heights maintained without fracture or subluxation.  Visualized skullbase intact.  Questionable subtle lucent lesion within the spinous process of C7, cannot exclude lytic/destructive lesion.  Lung apices clear.  IMPRESSION: No acute intracranial abnormalities.  Atrophy with minimal small vessel chronic ischemic changes of deep cerebral white matter.  Scattered degenerative disc and facet disease to changes throughout cervical spine.  No evidence acute cervical spine injury.  Questionable lucent process within the C7 spinous process, nonspecific, cannot exclude a lytic metastatic lesion ; this would be atypical for prostate cancer which typically causes sclerotic metastases.    Electronically Signed   By: Lavonia Dana M.D.   On: 05/19/2013 15:02   Dg Chest Portable 1 View  06/05/2013   CLINICAL DATA Altered mental status.  EXAM PORTABLE CHEST - 1 VIEW  COMPARISON 05/19/2013  FINDINGS Bibasilar atelectasis. Heart is upper limits normal in size. No effusions. No acute bony abnormality.  IMPRESSION Bibasilar atelectasis.  SIGNATURE  Electronically Signed   By: Rolm Baptise M.D.   On: 06/05/2013 13:39   Dg Knee Complete 4 Views Right  06/05/2013   CLINICAL DATA Right knee pain  EXAM RIGHT KNEE - COMPLETE 4+ VIEW  COMPARISON None.  FINDINGS Mild osteophytic changes are noted medially. Some meniscal calcifications are seen. No sizable joint effusion is noted. Soft tissue swelling is noted. No fractures are seen.  IMPRESSION Chronic changes without acute abnormality.  SIGNATURE  Electronically Signed   By: Inez Catalina M.D.   On: 06/05/2013 14:32   Dg Foot Complete Left  05/19/2013   CLINICAL DATA:  Fall, left foot pain  EXAM: LEFT FOOT - COMPLETE 3+ VIEW  COMPARISON:  None.  FINDINGS: Bones are osteopenic. Nondisplaced fracture of the left fifth toe proximal phalanx noted. Degenerative changes of the left first MTP joint. Metatarsals appear intact. No definite soft tissue abnormality.  IMPRESSION: Nondisplaced fracture left fifth toe proximal phalanx.   Electronically Signed   By: Daryll Brod M.D.   On: 05/19/2013 15:21    Oren Binet, MD  [facility] Pager:[phone]  If 7PM-7AM, please contact night-coverage [email or web address] Password [email or web address] 06/07/2013, 10:54 AM   LOS: 2 days

## 2013-06-07 NOTE — Evaluation (Addendum)
Physical Therapy Evaluation Patient Details Name: [name] MRN: [id number] DOB: 1914-11-03 Today's Date: 06/07/2013 Time: 4259-5638 PT Time Calculation (min): 16 min  PT Assessment / Plan / Recommendation History of Present Illness  [name] is a 80 y.o. male with pmh significant for dementia, HTN, DM and CKD stage 3-4; transferred from SNF where he reside due to changes in mental status. Patient at baseline conversant and able to follow commands; today found to be unable to follow commands and barely speaking and just moaning. No focal deficit appreciated or reported.  SNF also endorses stinky/concentrated urine.    Clinical Impression  Eval limited by pt AMS; Will benefit from  1 week trial of PT to address deficits below;  He appears to have been ambulatory  prior to adm from ALF setting; will recommend SNF vs HHPT if pt able to return to [facility]/ALF setting; He follows commands quite inconsistently at time of this eval  and c/o diffuse pain-pain to light touch everywhere; Will continue to follow for the trial and assess for further needs.     PT Assessment  Patient needs continued PT services    Follow Up Recommendations  SNF (vs HHPT at ALF-memory care-/[facility])    Does the patient have the potential to tolerate intense rehabilitation      Barriers to Discharge        Equipment Recommendations  None recommended by PT    Recommendations for Other Services     Frequency Min 2X/week    Precautions / Restrictions Precautions Precautions: Fall   Pertinent Vitals/Pain See above      Mobility  Bed Mobility Overal bed mobility: +2 for physical assistance;Needs Assistance Bed Mobility: Supine to Sit Supine to sit: Max assist;+2 for physical assistance Partial sit to supine +2 max/total assist General bed mobility comments: pt resists coming to full sit on EOB, requires assist with bil LEs and trunk, self assists by moving LEs only;  +2 total assist to scoot up in bed  in supine position Transfers General transfer comment: not tested, pt uncooperative    Exercises     PT Diagnosis: Generalized weakness  PT Problem List: Decreased activity tolerance;Decreased balance;Decreased mobility;Decreased cognition PT Treatment Interventions: Gait training;Functional mobility training;Therapeutic activities;Therapeutic exercise;Patient/family education;DME instruction;Balance training     PT Goals(Current goals can be found in the care plan section) Acute Rehab PT Goals Patient Stated Goal: pt unable to state PT Goal Formulation: Patient unable to participate in goal setting Time For Goal Achievement: 06/14/13 Potential to Achieve Goals: Fair  Visit Information  Last PT Received On: 06/07/13 Assistance Needed: +2 History of Present Illness: [name] is a 80 y.o. male with pmh significant for dementia, HTN, DM and CKD stage 3-4; transferred from SNF where he reside due to changes in mental status. Patient at baseline conversant and able to follow commands; today found to be unable to follow commands and barely speaking and just moaning. No focal deficit appreciated or reported.  SNF also endorses stinky/concentrated urine.         Prior [place] expects to be discharged to:: Assisted living Additional Comments: pt from [facility] Prior Function Comments: pt unable to give info, no family present; appears to have been ambulatroy at baseline    Cognition  Cognition Arousal/Alertness: Awake/alert Behavior During Therapy: Restless (easily agitiated) Overall Cognitive Status: History of cognitive impairments - at baseline    Extremity/Trunk Assessment Upper Extremity Assessment Upper Extremity Assessment: Difficult to assess due to impaired  cognition Lower Extremity Assessment Lower Extremity Assessment: Difficult to assess due to impaired cognition RLE Deficits / Details: pt RLE appears edematous compared to LLE; he  assists with LE movement and then begins to resist for bil LEs; some spontaneous movement  LEs obsvered during eval, unable to formally test due to cognition   Balance Balance Overall balance assessment: Needs assistance Sitting balance-Leahy Scale: Zero  End of Session PT - End of Session Activity Tolerance: Treatment limited secondary to agitation Patient left: in bed;with call bell/phone within reach;with bed alarm set  GP     [facility] 06/07/2013, 1:43 PM

## 2013-06-07 NOTE — Progress Notes (Signed)
Pt temp 100.5. MD paged. MD to put in order for suppository Tylenol. Follow up temp 100.1.

## 2013-06-07 NOTE — Progress Notes (Signed)
INITIAL NUTRITION ASSESSMENT  DOCUMENTATION CODES Per approved criteria  -Not Applicable   INTERVENTION: Interventions within [place]. Diet texture and liquid consistency per SLP/MD. If aggressive nutrition therapy desired, please consult RD. RD to continue to follow nutrition care plan.  NUTRITION DIAGNOSIS: Inadequate oral intake related to inability to eat as evidenced by NPO status.  Goal: Diet advancement per team's discretion. Interventions within [place].  Monitor:  weight trends, lab trends, I/O's, diet advancement, [place]  Reason for Assessment: Low Braden Score  80 y.o. male  Admitting Dx: Encephalopathy acute  ASSESSMENT: PMHx significant for dementia, HTN, DM, CKD 3-4. From SNF. Admitted with AMS. Work-up reveals UTI.  BSE completed by SLP. Recommendations for MBSS. MBSS completed this morning with recommendations of NPO 2/2 cognition. SLP recommending NPO status for 1-2 as pt gets treatment.  Palliative care consulted for [place]. Per chart, plan for IV fluids and abx for a day or so, if no improvement, pt may need to transition to comfort measures. Per MD, "we'll not plan on the form of artificial feeding given overall poor prognosis." POA reports to SLP that pt has had decreased PO intake over the past week.  Sodium and potassium WNL CBG's: 117, 140, 128 Meds of note: IVF of NS at 50 ml/hr, vitamin D, novolog and lantus, protonix  Height: Ht Readings from Last 1 Encounters:  06/05/13 5' 3.5" (1.613 m)    Weight: Wt Readings from Last 1 Encounters:  06/05/13 165 lb 11.2 oz (75.161 kg)    Ideal Body Weight: 124 lb  % Ideal Body Weight: 133%  Wt Readings from Last 10 Encounters:  06/05/13 165 lb 11.2 oz (75.161 kg)  04/04/13 167 lb (75.751 kg)  03/19/13 165 lb (74.844 kg)  12/10/09 191 lb (86.637 kg)  05/13/09 192 lb (87.091 kg)  04/03/09 190 lb (86.183 kg)  10/02/08 186 lb (84.369 kg)    Usual Body Weight: 165 lb  % Usual Body Weight: 100%  BMI:  Body  mass index is 28.89 kg/(m^2). Overweight  Estimated Nutritional Needs: Kcal: 1400 - 1550 Protein: 65 - 75 g Fluid: at least 1.5 liters daily  Skin: intact  Diet Order: NPO  EDUCATION NEEDS: -No education needs identified at this time   Intake/Output Summary (Last 24 hours) at 06/07/13 1155 Last data filed at 06/07/13 0534  Gross per 24 hour  Intake    140 ml  Output    750 ml  Net   -610 ml    Last BM: PTA  Labs:   Recent Labs Lab 06/05/13 1302 06/06/13 0720 06/07/13 0657  NA 139 138 140  K 4.4 4.0 3.7  CL 98 98 101  CO2 28 25 24   BUN 24* 17 20  CREATININE 1.46* 1.20 1.48*  CALCIUM 9.3 9.0 8.8  GLUCOSE 312* 229* 125*    CBG (last 3)   Recent Labs  06/06/13 1658 06/06/13 2208 06/07/13 0745  GLUCAP 128* 140* 117*    Scheduled Meds: . amLODipine  5 mg Oral Daily  . aspirin EC  81 mg Oral Daily  . atenolol  100 mg Oral QPM  . benazepril  20 mg Oral Daily  . cefTRIAXone (ROCEPHIN)  IV  1 g Intravenous Q24H  . cholecalciferol  1,000 Units Oral Daily  . vitamin B-12  500 mcg Oral Daily  . gabapentin  100 mg Oral QHS  . heparin  5,000 Units Subcutaneous 3 times per day  . insulin aspart  0-9 Units Subcutaneous TID WC  .  insulin glargine  15 Units Subcutaneous Daily  . magnesium oxide  400 mg Oral TID  . pantoprazole  40 mg Oral Q1200  . tamsulosin  0.4 mg Oral Daily    Continuous Infusions: . sodium chloride      Past Medical History  Diagnosis Date  . Dementia   . Diabetes mellitus   . Hypertension   . Cancer     prostrate  . Cirrhosis   . GERD (gastroesophageal reflux disease)     Past Surgical History  Procedure Laterality Date  . Appendectomy      Inda Coke MS, RD, LDN Inpatient Registered Dietitian Pager: [phone] After-hours pager: [phone]

## 2013-06-07 NOTE — Care Management Note (Signed)
    Page 1 of 1   06/10/2013     3:39:07 PM   CARE MANAGEMENT NOTE 06/10/2013  Patient:  [name],[name]   Account Number:  [id number]  Date Initiated:  06/07/2013  Documentation initiated by:  Tomi Bamberger  Subjective/Objective Assessment:   dx encephalopathy, uti  admit- from [facility]     Action/Plan:   pt eval- rec snf vs HHPT at ALF  palliative- recs residential hospice.   Anticipated DC Date:  06/10/2013   Anticipated DC Plan:  [place]  In-house referral  Clinical Social Worker      DC Planning Services  CM consult      Choice offered to / List presented to:  C-1 Patient           Status of service:  Completed, signed off Medicare Important Message given?   (If response is "NO", the following Medicare IM given date fields will be blank) Date Medicare IM given:   Date Additional Medicare IM given:    Discharge Disposition:  [place]  Per UR Regulation:  Reviewed for med. necessity/level of care/duration of stay  If discussed at [place] of Stay Meetings, dates discussed:    Comments:  06/10/13 [place],  BSN [phone] patient for dc to [facility] today, CSW following.

## 2013-06-07 NOTE — Evaluation (Signed)
Clinical/Bedside Swallow Evaluation Patient Details  Name: [name] MRN: [id number] Date of Birth: Aug 11, 1914  Today's Date: 06/07/2013 Time: 1012-1037 SLP Time Calculation (min): 25 min  Past Medical History:  Past Medical History  Diagnosis Date  . Dementia   . Diabetes mellitus   . Hypertension   . Cancer     prostrate  . Cirrhosis   . GERD (gastroesophageal reflux disease)    Past Surgical History:  Past Surgical History  Procedure Laterality Date  . Appendectomy     HPI:  80 y.o. male with pmh significant for dementia, HTN, DM and CKD stage 3-4; transferred from SNF where he resides due to changes in mental status on 3./11. SNF reports baseline conversant and able to follow commands; Probably UTI. Family member reports pt typically able to eat soft foods and thin liquids. She observed coughing and choking, wet vocal quality with breakfast.    Assessment / Plan / Recommendation Clinical Impression  Pt demonstrates significant evidence of oropharyngeal dysphagia with delayed swallow, mulitple swallows and audible residue, does not clear with max cues. Suction applied to BOT with significant residue retrieved which then elicited some sentation and cough/throat clearing with swallowing. Based on presentation pt may have difficulty tolerating any texture, thickened or otherwise. Will proceed with MBS to determine best diet and guide plan of care. Pt to remain NPO. Niece in agreement.     Aspiration Risk  Severe    Diet Recommendation NPO   Medication Administration: Via alternative means    Other  Recommendations Recommended Consults: MBS   Follow Up Recommendations  Skilled Nursing facility    Frequency and Duration        Pertinent Vitals/Pain NA    SLP Swallow Goals     Swallow Study Prior Functional Status       General HPI: 80 y.o. male with pmh significant for dementia, HTN, DM and CKD stage 3-4; transferred from SNF where he resides due to changes in  mental status on 3./11. SNF reports baseline conversant and able to follow commands; Probably UTI. Family member reports pt typically able to eat soft foods and thin liquids. She observed coughing and choking, wet vocal quality with breakfast.  Type of Study: Bedside swallow evaluation Previous Swallow Assessment: none Diet Prior to this Study: Dysphagia 3 (soft);Thin liquids Temperature Spikes Noted: Yes Respiratory Status: Room air History of Recent Intubation: No Behavior/Cognition: Alert;Doesn't follow directions Oral Cavity - Dentition: Adequate natural dentition Self-Feeding Abilities: Total assist Patient Positioning: Upright in bed Baseline Vocal Quality: Wet Volitional Cough: Cognitively unable to elicit Volitional Swallow: Unable to elicit    Oral/Motor/Sensory Function Overall Oral Motor/Sensory Function: Other (comment) (doesnt follow commands to attempt)   Ice Chips     Thin Liquid Thin Liquid: Impaired Presentation: Cup;Self Fed Oral Phase Impairments: Reduced labial seal;Impaired anterior to posterior transit Pharyngeal  Phase Impairments: Suspected delayed Swallow;Decreased hyoid-laryngeal movement;Multiple swallows;Wet Vocal Quality    Nectar Thick Nectar Thick Liquid: Not tested   Honey Thick Honey Thick Liquid: Not tested   Puree Puree: Not tested   Solid   GO    Solid: Impaired Presentation:  (neice gave him a banana) Oral Phase Impairments: Reduced labial seal;Impaired anterior to posterior transit Oral Phase Functional Implications: Oral holding Pharyngeal Phase Impairments: Suspected delayed Swallow;Multiple swallows      Herbie [place], MA CCC-SLP [phone]  Nyshaun Standage, Katherene Ponto 06/07/2013,10:45 AM

## 2013-06-08 LAB — BASIC METABOLIC PANEL
BUN: 20 mg/dL (ref 6–23)
CALCIUM: 8.3 mg/dL — AB (ref 8.4–10.5)
CO2: 22 mEq/L (ref 19–32)
CREATININE: 1.29 mg/dL (ref 0.50–1.35)
Chloride: 102 mEq/L (ref 96–112)
GFR calc Af Amer: 52 mL/min — ABNORMAL LOW (ref >90)
GFR calc non Af Amer: 45 mL/min — ABNORMAL LOW (ref >90)
Glucose, Bld: 141 mg/dL — ABNORMAL HIGH (ref 70–99)
Potassium: 3.7 mEq/L (ref 3.7–5.3)
Sodium: 139 mEq/L (ref 137–147)

## 2013-06-08 LAB — GLUCOSE, CAPILLARY
GLUCOSE-CAPILLARY: 122 mg/dL — AB (ref 70–99)
Glucose-Capillary: 136 mg/dL — ABNORMAL HIGH (ref 70–99)
Glucose-Capillary: 143 mg/dL — ABNORMAL HIGH (ref 70–99)
Glucose-Capillary: 183 mg/dL — ABNORMAL HIGH (ref 70–99)

## 2013-06-08 LAB — CBC
HCT: 34.3 % — ABNORMAL LOW (ref 39.0–52.0)
Hemoglobin: 11.4 g/dL — ABNORMAL LOW (ref 13.0–17.0)
MCH: 27.9 pg (ref 26.0–34.0)
MCHC: 33.2 g/dL (ref 30.0–36.0)
MCV: 84.1 fL (ref 78.0–100.0)
Platelets: 219 10*3/uL (ref 150–400)
RBC: 4.08 MIL/uL — AB (ref 4.22–5.81)
RDW: 13.8 % (ref 11.5–15.5)
WBC: 9.5 10*3/uL (ref 4.0–10.5)

## 2013-06-08 MED ORDER — LORAZEPAM 2 MG/ML IJ SOLN
0.5000 mg | Freq: Four times a day (QID) | INTRAMUSCULAR | Status: DC | PRN
  Administered 2013-06-09: 0.5 mg via INTRAVENOUS
  Filled 2013-06-08: qty 1

## 2013-06-08 NOTE — Progress Notes (Signed)
Speech Language Pathology Treatment: Dysphagia  Patient Details Name: [name] MRN: [id number] DOB: [birth year] Today's Date: 06/08/2013 Time: 0102-7253 SLP Time Calculation (min): 30 min  Assessment / Plan / Recommendation Clinical Impression  MD reports that pt much more alert today; was in need of oral care and tolerated well with suction. Pt continues to demonstrate s/s of significant oral/ pharyngeal dysphagia with overt s/s of aspiration with all consistencies attempted- ice chips, thin/ nectar thick liquids, and puree. Cough, gurgly vocal quality, and oral stasis present, delayed swallow trigger. Pt did independently make attempts to swallow residue; sounded like some pharyngeal residue remained but oral residue was still visible. Commented that he still felt something stuck in his throat and that throat was sore. Followed max cues to cough/ clear throat. Rx that pt remain NPO at this time, meds still with puree; make sure to check for residue following meds. Oral care essential for this pt at least 4x/ day. Speech will continue to follow.   HPI HPI: 80 y.o. male with pmh significant for dementia, HTN, DM and CKD stage 3-4; transferred from SNF where he resides due to changes in mental status on 3./11. SNF reports baseline conversant and able to follow commands; Probably UTI. Family member reports pt typically able to eat soft foods and thin liquids. She observed coughing and choking, wet vocal quality with breakfast. MBS yesterday revealed oral and pharyngeal dysphagia characterized by stasis at base of tongue and in pharynx, multiple swallows, delayed swallow- penetration and aspiration. Pt NPO.   Pertinent Vitals n/a  SLP Plan  Continue with current plan of care    Recommendations Diet recommendations: NPO Medication Administration: Crushed with puree Compensations: Multiple dry swallows after each bite/sip Postural Changes and/or Swallow Maneuvers: Seated upright 90 degrees            Oral Care Recommendations: Oral care Q4 per protocol Follow up Recommendations: Skilled Nursing facility Plan: Continue with current plan of care    [place], Amy K 06/08/2013, 11:37 AM

## 2013-06-08 NOTE — Progress Notes (Signed)
PATIENT DETAILS Name: [name] Age: 80 y.o. Sex: male Date of Birth: [birth year] Admit Date: 06/05/2013 Admitting Physician No admitting provider for patient encounter. IHK:[id number],LOVFIEP DAVID, MD  Subjective: Much more alert than yesterday following IV fluid hydration, unfortunately still no improvement in swallowing.  Assessment/Plan: Encephalopathy acute:  - due to presumed UTI.  - CT head (-) for acute changes  - Much better on 3/14 compared with 3/13, continue treatment of underlying UTI.   UTI  -c/w Rocephin-day 4 - Urine culture negative -unfortunately not drawn on admission-drawn while already on IV antibiotics, therefore will continue with Rocephin for a total of 5 days and stop.   DM:  -continue lantus and start SSI -CBG's stable  -will stop metformin while inpatient  -low carb diet   Hypertensive Urgency  -BP uncontrolled but better than what it was a few days ago,c/wAtenolol, Benazepril, and Amlodipine  -PRN hydralazine ordered   Dysphagia - Likely worsened by acute illness and encephalopathy, suspect some amount of chronic dysphagia given history of dementia and advanced age. - Unfortunately even with improvement of mental status on 3/14, continues to not do well on swallow evaluation. - This M.D., had a long discussion with the patient's healthcare power of attorney at bedside on 3/14, she agrees to comfort feeding, accepts all risks of aspiration. Therefore we'll cautiously resume a dysphagia 1 diet. RN aware that patient will need assistance with feeding at all times.  CHRONIC KIDNEY DISEASE UNSPECIFIED:  -stable and at baseline  -will monitor   Dementia:  -continue supportive care   GERD  -continue PPI   BPH  - Continue with Flomax  Ethics/palliative care -on 3/13  Spoke with [name] [facility]) [phone] patient's niece and healthcare power of attorney, poor prognosis, declining condition discussed in detail. DO NOT RESUSCITATE  reaffirmed. Explained that we will try with IV fluids, IV antibiotics for another day or so, if no improvement may need to transition to comfort measures. She is agreeable. We'll not plan on the form of artificial feeding given overall poor prognosis. Have consulted palliative care.  - On 3/14-spoke with Ms. McFarland at bedside, although mental status improved, not doing well in terms of swallowing ability. Mental status probably improved after IV fluid hydration overnight. Ms. Einar Gip wants to start comfort feeds in the form of a dysphagia 1 diet, she is accepting all risks of aspiration. Therefore we'll resume dysphagia 1 diet. - Suspect patient may need residential hospice on discharge.  Disposition: Remain inpatient  DVT Prophylaxis: Prophylactic Heparin  Code Status: DNR  Family Communication [name] [facility]) [phone]   Procedures:  None  CONSULTS:  Palliative Care   MEDICATIONS: Scheduled Meds: . amLODipine  5 mg Oral Daily  . aspirin EC  81 mg Oral Daily  . atenolol  100 mg Oral QPM  . benazepril  20 mg Oral Daily  . cefTRIAXone (ROCEPHIN)  IV  1 g Intravenous Q24H  . cholecalciferol  1,000 Units Oral Daily  . vitamin B-12  500 mcg Oral Daily  . gabapentin  100 mg Oral QHS  . heparin  5,000 Units Subcutaneous 3 times per day  . insulin aspart  0-9 Units Subcutaneous TID WC  . insulin glargine  15 Units Subcutaneous Daily  . magnesium oxide  400 mg Oral TID  . pantoprazole  40 mg Oral Q1200  . tamsulosin  0.4 mg Oral Daily   Continuous Infusions: . sodium chloride 50 mL/hr at 06/08/13 0522  PRN Meds:.acetaminophen, hydrALAZINE, ondansetron (ZOFRAN) IV, ondansetron  Antibiotics: Anti-infectives   Start     Dose/Rate Route Frequency Ordered Stop   06/05/13 2000  cefTRIAXone (ROCEPHIN) 1 g in dextrose 5 % 50 mL IVPB     1 g 100 mL/hr over 30 Minutes Intravenous Every 24 hours 06/05/13 1834        PHYSICAL EXAM: Vital signs in last 24  hours: Filed Vitals:   06/07/13 1350 06/07/13 1456 06/07/13 2100 06/08/13 0411  BP: 225/83 167/66 166/74 166/69  Pulse: 62 80 71 64  Temp: 98.4 F (36.9 C)  98.8 F (37.1 C) 98.4 F (36.9 C)  TempSrc: Oral  Oral Oral  Resp: 20  20 20   Height:      Weight:      SpO2: 96%  96% 99%    Weight change:  Filed Weights   06/05/13 2214  Weight: 75.161 kg (165 lb 11.2 oz)   Body mass index is 28.89 kg/(m^2).   Gen Exam: More awake and alert. Neck: Supple, No JVD.   Chest: B/L Clear.  CVS: S1 S2 Regular, no murmurs.  Abdomen: soft, BS +, non tender, non distended.  Extremities: no edema, lower extremities warm to touch. Neurologic: Non Focal.   Skin: No Rash.   Wounds: N/A.    Intake/Output from previous day:  Intake/Output Summary (Last 24 hours) at 06/08/13 1257 Last data filed at 06/08/13 0522  Gross per 24 hour  Intake 888.33 ml  Output   1050 ml  Net -161.67 ml     LAB RESULTS: CBC  Recent Labs Lab 06/05/13 1302 06/06/13 0720 06/07/13 0657 06/08/13 0700  WBC 10.0 12.1* 11.3* 9.5  HGB 13.3 12.2* 12.0* 11.4*  HCT 46.3 36.5* 35.6* 34.3*  PLT 155 199 226 219  MCV 98.3 84.5 83.8 84.1  MCH 28.2 28.2 28.2 27.9  MCHC 28.7* 33.4 33.7 33.2  RDW 14.5 13.4 13.8 13.8    Chemistries   Recent Labs Lab 06/05/13 1302 06/06/13 0720 06/07/13 0657 06/08/13 0700  NA 139 138 140 139  K 4.4 4.0 3.7 3.7  CL 98 98 101 102  CO2 28 25 24 22   GLUCOSE 312* 229* 125* 141*  BUN 24* 17 20 20   CREATININE 1.46* 1.20 1.48* 1.29  CALCIUM 9.3 9.0 8.8 8.3*    CBG:  Recent Labs Lab 06/07/13 0745 06/07/13 1216 06/07/13 1655 06/07/13 2225 06/08/13 0752  GLUCAP 117* 205* 204* 155* 136*    GFR Estimated Creatinine Clearance: 30.7 ml/min (by C-G formula based on Cr of 1.29).  Coagulation profile  Recent Labs Lab 06/05/13 1302  INR 1.05    Cardiac Enzymes No results found for this basename: CK, CKMB, TROPONINI, MYOGLOBIN,  in the last 168 hours  No components  found with this basename: POCBNP,  No results found for this basename: DDIMER,  in the last 72 hours No results found for this basename: HGBA1C,  in the last 72 hours No results found for this basename: CHOL, HDL, LDLCALC, TRIG, CHOLHDL, LDLDIRECT,  in the last 72 hours  Recent Labs  06/05/13 2100  TSH 0.626    Recent Labs  06/05/13 2100  VITAMINB12 895   No results found for this basename: LIPASE, AMYLASE,  in the last 72 hours  Urine Studies No results found for this basename: UACOL, UAPR, USPG, UPH, UTP, UGL, UKET, UBIL, UHGB, UNIT, UROB, ULEU, UEPI, UWBC, URBC, UBAC, CAST, CRYS, UCOM, BILUA,  in the last 72 hours  MICROBIOLOGY: Recent Results (from  the past 240 hour(s))  MRSA PCR SCREENING     Status: None   Collection Time    06/06/13 12:18 AM      Result Value Ref Range Status   MRSA by PCR NEGATIVE  NEGATIVE Final   Comment:            The GeneXpert MRSA Assay (FDA     approved for NASAL specimens     only), is one component of a     comprehensive MRSA colonization     surveillance program. It is not     intended to diagnose MRSA     infection nor to guide or     monitor treatment for     MRSA infections.  URINE CULTURE     Status: None   Collection Time    06/06/13 10:47 AM      Result Value Ref Range Status   Specimen Description URINE, RANDOM   Final   Special Requests NONE   Final   Culture  Setup Time     Final   Value: 06/06/2013 14:50     Performed at [place]     Final   Value: NO GROWTH     Performed at [facility]   Culture     Final   Value: NO GROWTH     Performed at [facility]   Report Status 06/07/2013 FINAL   Final    RADIOLOGY STUDIES/RESULTS: Dg Chest 2 View  05/19/2013   CLINICAL DATA:  Left foot pain.  Confusion and combative  EXAM: CHEST  2 VIEW  COMPARISON:  03/28/2013  FINDINGS: Normal heart size. No pleural effusion or edema. Lung volumes appear low. Mild spondylosis identified within  the thoracic spine.  IMPRESSION: 1. Low lung volumes.   Electronically Signed   By: Kerby Moors M.D.   On: 05/19/2013 15:17   Dg Hip Complete Right  06/05/2013   CLINICAL DATA Right hip pain  EXAM RIGHT HIP - COMPLETE 2+ VIEW  COMPARISON None.  FINDINGS Mild degenerative changes of the right hip joint are noted. No acute fracture dislocation is noted. No gross soft tissue abnormality is seen.  IMPRESSION No acute abnormality noted.  SIGNATURE  Electronically Signed   By: Inez Catalina M.D.   On: 06/05/2013 14:31   Ct Head Wo Contrast  06/05/2013   CLINICAL DATA Altered mental status  EXAM CT HEAD WITHOUT CONTRAST  TECHNIQUE Contiguous axial images were obtained from the base of the skull through the vertex without intravenous contrast.  COMPARISON CT 05/19/2013  FINDINGS Generalized atrophy. Chronic microvascular ischemic changes. Negative for acute infarct. Negative for hemorrhage or mass.  Poorly developed mastoid tip on the right likely related to childhood infections. No acute bony change. Retention cyst right maxillary sinus.  IMPRESSION Atrophy and chronic microvascular ischemia.  No acute abnormality.  SIGNATURE  Electronically Signed   By: Franchot Gallo M.D.   On: 06/05/2013 14:55   Ct Head Wo Contrast  05/19/2013   CLINICAL DATA:  [place] at nursing home, history dementia, diabetes, hypertension, prostate cancer  EXAM: CT HEAD WITHOUT CONTRAST  CT CERVICAL SPINE WITHOUT CONTRAST  TECHNIQUE: Multidetector CT imaging of the head and cervical spine was performed following the standard protocol without intravenous contrast. Multiplanar CT image reconstructions of the cervical spine were also generated.  COMPARISON:  CT head 12/26/2011  FINDINGS: CT HEAD FINDINGS  Generalized atrophy.  Normal ventricular morphology.  No midline  shift or mass effect.  Small vessel chronic ischemic changes of deep cerebral white matter.  No intracranial hemorrhage, mass lesion, or acute infarction.  Mucosal retention cyst  right maxillary sinus.  Visualized paranasal sinuses and mastoid air cells otherwise clear.  Bones unremarkable.  Minimal atherosclerotic calcification carotid siphons.  CT CERVICAL SPINE FINDINGS  Prevertebral soft tissues normal thickness.  Osseous mineralization normal.  Scattered endplate spur formation and disc space narrowing throughout cervical spine.  Scattered facet degenerative changes bilaterally with probable ankylosis of the left C3-C4 facet joint.  Vertebral body heights maintained without fracture or subluxation.  Visualized skullbase intact.  Questionable subtle lucent lesion within the spinous process of C7, cannot exclude lytic/destructive lesion.  Lung apices clear.  IMPRESSION: No acute intracranial abnormalities.  Atrophy with minimal small vessel chronic ischemic changes of deep cerebral white matter.  Scattered degenerative disc and facet disease to changes throughout cervical spine.  No evidence acute cervical spine injury.  Questionable lucent process within the C7 spinous process, nonspecific, cannot exclude a lytic metastatic lesion ; this would be atypical for prostate cancer which typically causes sclerotic metastases.   Electronically Signed   By: Lavonia Dana M.D.   On: 05/19/2013 15:02   Ct Cervical Spine Wo Contrast  05/19/2013   CLINICAL DATA:  [place] at nursing home, history dementia, diabetes, hypertension, prostate cancer  EXAM: CT HEAD WITHOUT CONTRAST  CT CERVICAL SPINE WITHOUT CONTRAST  TECHNIQUE: Multidetector CT imaging of the head and cervical spine was performed following the standard protocol without intravenous contrast. Multiplanar CT image reconstructions of the cervical spine were also generated.  COMPARISON:  CT head 12/26/2011  FINDINGS: CT HEAD FINDINGS  Generalized atrophy.  Normal ventricular morphology.  No midline shift or mass effect.  Small vessel chronic ischemic changes of deep cerebral white matter.  No intracranial hemorrhage, mass lesion, or acute  infarction.  Mucosal retention cyst right maxillary sinus.  Visualized paranasal sinuses and mastoid air cells otherwise clear.  Bones unremarkable.  Minimal atherosclerotic calcification carotid siphons.  CT CERVICAL SPINE FINDINGS  Prevertebral soft tissues normal thickness.  Osseous mineralization normal.  Scattered endplate spur formation and disc space narrowing throughout cervical spine.  Scattered facet degenerative changes bilaterally with probable ankylosis of the left C3-C4 facet joint.  Vertebral body heights maintained without fracture or subluxation.  Visualized skullbase intact.  Questionable subtle lucent lesion within the spinous process of C7, cannot exclude lytic/destructive lesion.  Lung apices clear.  IMPRESSION: No acute intracranial abnormalities.  Atrophy with minimal small vessel chronic ischemic changes of deep cerebral white matter.  Scattered degenerative disc and facet disease to changes throughout cervical spine.  No evidence acute cervical spine injury.  Questionable lucent process within the C7 spinous process, nonspecific, cannot exclude a lytic metastatic lesion ; this would be atypical for prostate cancer which typically causes sclerotic metastases.   Electronically Signed   By: Lavonia Dana M.D.   On: 05/19/2013 15:02   Dg Chest Portable 1 View  06/05/2013   CLINICAL DATA Altered mental status.  EXAM PORTABLE CHEST - 1 VIEW  COMPARISON 05/19/2013  FINDINGS Bibasilar atelectasis. Heart is upper limits normal in size. No effusions. No acute bony abnormality.  IMPRESSION Bibasilar atelectasis.  SIGNATURE  Electronically Signed   By: Rolm Baptise M.D.   On: 06/05/2013 13:39   Dg Knee Complete 4 Views Right  06/05/2013   CLINICAL DATA Right knee pain  EXAM RIGHT KNEE - COMPLETE 4+ VIEW  COMPARISON None.  FINDINGS  Mild osteophytic changes are noted medially. Some meniscal calcifications are seen. No sizable joint effusion is noted. Soft tissue swelling is noted. No fractures are  seen.  IMPRESSION Chronic changes without acute abnormality.  SIGNATURE  Electronically Signed   By: Inez Catalina M.D.   On: 06/05/2013 14:32   Dg Foot Complete Left  05/19/2013   CLINICAL DATA:  Fall, left foot pain  EXAM: LEFT FOOT - COMPLETE 3+ VIEW  COMPARISON:  None.  FINDINGS: Bones are osteopenic. Nondisplaced fracture of the left fifth toe proximal phalanx noted. Degenerative changes of the left first MTP joint. Metatarsals appear intact. No definite soft tissue abnormality.  IMPRESSION: Nondisplaced fracture left fifth toe proximal phalanx.   Electronically Signed   By: Daryll Brod M.D.   On: 05/19/2013 15:21    Oren Binet, MD  [facility] Pager:[phone]  If 7PM-7AM, please contact night-coverage [email or web address] Password [email or web address] 06/08/2013, 12:57 PM   LOS: 3 days

## 2013-06-09 DIAGNOSIS — C61 Malignant neoplasm of prostate: Secondary | ICD-10-CM

## 2013-06-09 DIAGNOSIS — G9340 Encephalopathy, unspecified: Secondary | ICD-10-CM

## 2013-06-09 DIAGNOSIS — R4182 Altered mental status, unspecified: Secondary | ICD-10-CM

## 2013-06-09 DIAGNOSIS — Z515 Encounter for palliative care: Secondary | ICD-10-CM

## 2013-06-09 DIAGNOSIS — N189 Chronic kidney disease, unspecified: Secondary | ICD-10-CM

## 2013-06-09 LAB — GLUCOSE, CAPILLARY
Glucose-Capillary: 171 mg/dL — ABNORMAL HIGH (ref 70–99)
Glucose-Capillary: 163 mg/dL — ABNORMAL HIGH (ref 70–99)
Glucose-Capillary: 75 mg/dL (ref 70–99)

## 2013-06-09 MED ORDER — LORAZEPAM 1 MG PO TABS
1.0000 mg | ORAL_TABLET | Freq: Two times a day (BID) | ORAL | Status: DC
  Administered 2013-06-09 – 2013-06-10 (×3): 1 mg via ORAL
  Filled 2013-06-09 (×3): qty 1

## 2013-06-09 MED ORDER — LORAZEPAM 2 MG/ML IJ SOLN
0.5000 mg | Freq: Once | INTRAMUSCULAR | Status: AC
  Administered 2013-06-09: 0.5 mg via INTRAVENOUS

## 2013-06-09 MED ORDER — HALOPERIDOL LACTATE 5 MG/ML IJ SOLN: 1.0000 mg | Freq: Four times a day (QID) | INTRAMUSCULAR | Status: DC | PRN

## 2013-06-09 MED ORDER — MORPHINE SULFATE 10 MG/5ML PO SOLN
5.0000 mg | ORAL | Status: DC | PRN
  Administered 2013-06-09: 5 mg via ORAL
  Filled 2013-06-09: qty 5

## 2013-06-09 MED ORDER — INSULIN GLARGINE 100 UNIT/ML ~~LOC~~ SOLN
10.0000 [IU] | Freq: Every day | SUBCUTANEOUS | Status: DC
  Filled 2013-06-09: qty 0.1

## 2013-06-09 NOTE — Consult Note (Signed)
Palliative Medicine Team at [facility]  Date: 06/09/2013   Patient Name: [name]  DOB: 10-26-1914  MRN: [id number]  Age / Sex: 80 y.o., male   PCP: Unk Pinto, MD Referring Physician: Jonetta Osgood, MD  HPI/Reason for Consultation: 80 yo with advanced dementia admitted with agitation, dysphagia and severe hypertension. PMT consulted for goals of care given advanced age and co morbidities.  Participants in Discussion: [name] HCPOA  Goals/Summary of Discussion:  1. Code Status:  DNR  2. Scope of Treatment:  FULL COMFORT CARE  Do not rehospitalize or bring to ED unless symptoms cannot be controlled  No feeding tubes  Comfort feeding  Discontinue all invasive monitoring or procedures, no blood draws, minimize finger sticks.  Ok to continue oral meds for BP given how high BP was on admission-as long as he can swallow or is able.   3. Assessment/Plan:  Primary Diagnoses  1. Advanced Dementia 2. Terminal Dysphagia 3. Encephalopathy 4. UTI 5. CKD   Prognosis: Minimal to no PO intake <2 weeks  PPS 20    Active Symptoms 1. Severe agitation previously requiring restraints 2. Dysphagia 3. Immobility  4. Palliative Prophylaxis:   Bowel Regimen   Terminal Secretions  Breakthrough Pain and Dyspnea   Agitation and Delirium  Nausea  5. Psychosocial Spiritual Asssessment/Interventions:  Patient and Family Adjustment to Illness/Prognosis: Coping well-they only want comfort  Spiritual Concerns or Needs: none   6. Disposition: Patient appropriate for a [place] in agreement and asking about [employer].  ROS:  Unable to obtain Social History:   reports that he quit smoking about 42 years ago. He does not have any smokeless tobacco history on file. He reports that he does not drink alcohol or use illicit drugs. Living Situation: Memory Care Occupation: none  Family History: Family History  Problem Relation Age of  Onset  . Diabetes Mother     Active Medications:  Outpatient medications: Prescriptions prior to admission  Medication Sig Dispense Refill  . acetaminophen (TYLENOL) 650 MG CR tablet Take 650 mg by mouth every 4 (four) hours as needed for pain.      Marland Kitchen aspirin EC 81 MG tablet Take 81 mg by mouth daily.       Marland Kitchen atenolol (TENORMIN) 100 MG tablet Take 100 mg by mouth every evening.       . B Complex Vitamins (VITAMIN B COMPLEX) TABS Take 1 tablet by mouth daily.       . benazepril (LOTENSIN) 20 MG tablet Take 20 mg by mouth daily.       . Cholecalciferol (VITAMIN D-3) 5000 UNITS TABS Take 1 tablet by mouth daily.      Marland Kitchen gabapentin (NEURONTIN) 300 MG capsule Take 300 mg by mouth at bedtime.      Marland Kitchen LANTUS SOLOSTAR 100 UNIT/ML injection Inject 15 Units into the skin every morning.       . Magnesium 500 MG TABS Take 500 mg by mouth 3 (three) times daily.      . metFORMIN (GLUCOPHAGE-XR) 500 MG 24 hr tablet Take 1,000 mg by mouth every evening.      . white petrolatum (VASELINE) GEL Apply 1 application topically at bedtime.        Current medications: Infusions:    Scheduled Medications: . amLODipine  5 mg Oral Daily  . atenolol  100 mg Oral QPM  . benazepril  20 mg Oral Daily  . [START ON 06/10/2013] insulin glargine  10 Units Subcutaneous  Daily  . LORazepam  1 mg Oral BID  . tamsulosin  0.4 mg Oral Daily    PRN Medications: haloperidol lactate, LORazepam, morphine, ondansetron (ZOFRAN) IV, ondansetron   Vital Signs: BP 178/76  Pulse 69  Temp(Src) 98.3 F (36.8 C) (Oral)  Resp 18  Ht 5' 3.5" (1.613 m)  Wt 75.161 kg (165 lb 11.2 oz)  BMI 28.89 kg/m2  SpO2 100%   Physical Exam:  General appearance: Frail, sleeping Eyes: normal/arcus HENT: MM dry Neck: Trachea midline; FROM, supple, no thyromegaly or lymphadenopathy Lungs: scattered crackles CV: Irregular and tachy, +Murmur  Abdomen: Soft, non-tender; no masses or HSM Extremities: +edema Skin: Normal temperature, turgor  and texture; no rash, ulcers or subcutaneous nodules Psych: confused  Labs:  Basic or Comprehensive Metabolic Panel:    Component Value Date/Time   NA 139 06/08/2013 0700   K 3.7 06/08/2013 0700   CL 102 06/08/2013 0700   CO2 22 06/08/2013 0700   BUN 20 06/08/2013 0700   CREATININE 1.29 06/08/2013 0700   CREATININE 1.47* 04/04/2013 1302   GLUCOSE 141* 06/08/2013 0700   CALCIUM 8.3* 06/08/2013 0700   AST 13 06/05/2013 1302   ALT 12 06/05/2013 1302   ALKPHOS 105 06/05/2013 1302   BILITOT 0.4 06/05/2013 1302   PROT 7.0 06/05/2013 1302   ALBUMIN 2.9* 06/05/2013 1302     CBC:    Component Value Date/Time   WBC 9.5 06/08/2013 0700   HGB 11.4* 06/08/2013 0700   HCT 34.3* 06/08/2013 0700   PLT 219 06/08/2013 0700   MCV 84.1 06/08/2013 0700   NEUTROABS 2.6 04/04/2013 1302   LYMPHSABS 1.5 04/04/2013 1302   MONOABS 0.3 04/04/2013 1302   EOSABS 0.2 04/04/2013 1302   BASOSABS 0.0 04/04/2013 1302     BNP (last 3 results) No results found for this basename: PROBNP,  in the last 8760 hours  CBG (last 3)   Recent Labs  06/08/13 2147 06/09/13 0755 06/09/13 1250  GLUCAP 183* 171* 163*    Imaging:  No results found.  Other Data:  (2D echo, EKG...)   Educational Materials Given:  DNR: Yes  MOST: No Healthcare Power-of-Attorney: Yes    Time: 50 minutes. 245-350PM Greater than 50%  of this time was spent counseling and coordinating care related to the above assessment and plan.  Signed by: Acquanetta Chain, DO  06/09/2013, 3:35 PM  Please contact Palliative Medicine Team phone at [phone] for questions and concerns.

## 2013-06-09 NOTE — Clinical Social Work Note (Signed)
CSW continuing to follow for DC needs.  [name], [name], [name], [phone]

## 2013-06-09 NOTE — Progress Notes (Addendum)
PATIENT DETAILS Name: [name] Age: 80 y.o. Sex: male Date of Birth: 11-06-1914 Admit Date: 06/05/2013 Admitting Physician No admitting provider for patient encounter. [id number] DAVID, MD  Subjective: Altered this morning.In restraints-have discontinued.   Assessment/Plan: Encephalopathy acute:  - due to presumed UTI.  - CT head (-) for acute changes  - Mental status continues to wax and wane-unfortunately very altered this am, suspect delirum/sundowning.Start prn ativan, Haldol. Will also  schedule ativan at bedtime  UTI  -c/w Rocephin-day 5-stop Rocephin after today's dose - Urine culture negative -unfortunately not drawn on admission-drawn while already on IV antibiotics, therefore will continue with Rocephin for a total of 5 days and stop.   DM:  -continue lantus and start SSI -CBG's stable  -will stop metformin while inpatient  -low carb diet   Hypertensive Urgency  -BP continues to be very labile, c/wAtenolol, Benazepril, and Amlodipine as long as possible -PRN hydralazine ordered   Dysphagia - Likely worsened by acute illness and encephalopathy, suspect some amount of chronic dysphagia given history of dementia and advanced age. - Unfortunately even with improvement of mental status on 3/14, continues to not do well on swallow evaluation. - This M.D., had a long discussion with the patient's healthcare power of attorney at bedside on 3/14, she agrees to comfort feeding, accepts all risks of aspiration. Therefore we'll cautiously resume a dysphagia 1 diet. RN aware that patient will need assistance with feeding at all times.  CHRONIC KIDNEY DISEASE UNSPECIFIED:  -stable and at baseline  -will monitor   Dementia:  -continue supportive care   GERD  -will stop PPI  BPH  - Continue with Flomax  Ethics/palliative care -on 3/13  Spoke with [name] [facility]) [phone] patient's niece and healthcare power of attorney, poor prognosis,  declining condition discussed in detail. DO NOT RESUSCITATE reaffirmed. Explained that we will try with IV fluids, IV antibiotics for another day or so, if no improvement may need to transition to comfort measures. She is agreeable. We'll not plan on the form of artificial feeding given overall poor prognosis. Have consulted palliative care.  - On 3/14-spoke with Ms. McFarland at bedside, although mental status improved, not doing well in terms of swallowing ability. Mental status probably improved after IV fluid hydration overnight. Ms. Einar Gip wants to start comfort feeds in the form of a dysphagia 1 diet, she is accepting all risks of aspiration. Therefore we'll resume dysphagia 1 diet.  - Suspect patient may need residential hospice on discharge will consult Social Work. Will start to minimize medications at this time  Disposition: Remain inpatient  DVT Prophylaxis: Prophylactic Heparin  Code Status: DNR  Family Communication [name] [facility]) [phone]   Procedures:  None  CONSULTS:  Palliative Care   MEDICATIONS: Scheduled Meds: . amLODipine  5 mg Oral Daily  . aspirin EC  81 mg Oral Daily  . atenolol  100 mg Oral QPM  . benazepril  20 mg Oral Daily  . cefTRIAXone (ROCEPHIN)  IV  1 g Intravenous Q24H  . cholecalciferol  1,000 Units Oral Daily  . vitamin B-12  500 mcg Oral Daily  . gabapentin  100 mg Oral QHS  . heparin  5,000 Units Subcutaneous 3 times per day  . insulin aspart  0-9 Units Subcutaneous TID WC  . insulin glargine  15 Units Subcutaneous Daily  . magnesium oxide  400 mg Oral TID  . pantoprazole  40 mg Oral Q1200  . tamsulosin  0.4 mg Oral Daily   Continuous Infusions:   PRN Meds:.acetaminophen, haloperidol lactate, hydrALAZINE, LORazepam, morphine, ondansetron (ZOFRAN) IV, ondansetron  Antibiotics: Anti-infectives   Start     Dose/Rate Route Frequency Ordered Stop   06/05/13 2000  cefTRIAXone (ROCEPHIN) 1 g in dextrose 5 % 50 mL IVPB      1 g 100 mL/hr over 30 Minutes Intravenous Every 24 hours 06/05/13 1834 06/10/13 1959      PHYSICAL EXAM: Vital signs in last 24 hours: Filed Vitals:   06/08/13 1445 06/08/13 2100 06/09/13 0605 06/09/13 0706  BP: 162/71 164/70 193/84 178/76  Pulse: 64 72 63 69  Temp: 97.4 F (36.3 C) 98 F (36.7 C) 98.3 F (36.8 C)   TempSrc: Oral Oral Oral   Resp: 18 18 18    Height:      Weight:      SpO2: 97% 97% 100%     Weight change:  Filed Weights   06/05/13 2214  Weight: 75.161 kg (165 lb 11.2 oz)   Body mass index is 28.89 kg/(m^2).   Gen Exam:Very confused Neck: Supple, No JVD.   Chest: B/L Clear.  CVS: S1 S2 Regular Abdomen: soft, BS +, non tender, non distended.  Extremities: no edema, lower extremities warm to touch. Neurologic: Non Focal.   Skin: No Rash.   Wounds: N/A.    Intake/Output from previous day:  Intake/Output Summary (Last 24 hours) at 06/09/13 0850 Last data filed at 06/09/13 0606  Gross per 24 hour  Intake    150 ml  Output    750 ml  Net   -600 ml     LAB RESULTS: CBC  Recent Labs Lab 06/05/13 1302 06/06/13 0720 06/07/13 0657 06/08/13 0700  WBC 10.0 12.1* 11.3* 9.5  HGB 13.3 12.2* 12.0* 11.4*  HCT 46.3 36.5* 35.6* 34.3*  PLT 155 199 226 219  MCV 98.3 84.5 83.8 84.1  MCH 28.2 28.2 28.2 27.9  MCHC 28.7* 33.4 33.7 33.2  RDW 14.5 13.4 13.8 13.8    Chemistries   Recent Labs Lab 06/05/13 1302 06/06/13 0720 06/07/13 0657 06/08/13 0700  NA 139 138 140 139  K 4.4 4.0 3.7 3.7  CL 98 98 101 102  CO2 28 25 24 22   GLUCOSE 312* 229* 125* 141*  BUN 24* 17 20 20   CREATININE 1.46* 1.20 1.48* 1.29  CALCIUM 9.3 9.0 8.8 8.3*    CBG:  Recent Labs Lab 06/08/13 0752 06/08/13 1319 06/08/13 1700 06/08/13 2147 06/09/13 0755  GLUCAP 136* 122* 143* 183* 171*    GFR Estimated Creatinine Clearance: 30.7 ml/min (by C-G formula based on Cr of 1.29).  Coagulation profile  Recent Labs Lab 06/05/13 1302  INR 1.05    Cardiac  Enzymes No results found for this basename: CK, CKMB, TROPONINI, MYOGLOBIN,  in the last 168 hours  No components found with this basename: POCBNP,  No results found for this basename: DDIMER,  in the last 72 hours No results found for this basename: HGBA1C,  in the last 72 hours No results found for this basename: CHOL, HDL, LDLCALC, TRIG, CHOLHDL, LDLDIRECT,  in the last 72 hours No results found for this basename: TSH, T4TOTAL, FREET3, T3FREE, THYROIDAB,  in the last 72 hours No results found for this basename: VITAMINB12, FOLATE, FERRITIN, TIBC, IRON, RETICCTPCT,  in the last 72 hours No results found for this basename: LIPASE, AMYLASE,  in the last 72 hours  Urine Studies No results found for this basename: UACOL, UAPR, USPG, UPH,  UTP, UGL, UKET, UBIL, UHGB, UNIT, UROB, ULEU, UEPI, UWBC, URBC, UBAC, CAST, CRYS, UCOM, BILUA,  in the last 72 hours  MICROBIOLOGY: Recent Results (from the past 240 hour(s))  MRSA PCR SCREENING     Status: None   Collection Time    06/06/13 12:18 AM      Result Value Ref Range Status   MRSA by PCR NEGATIVE  NEGATIVE Final   Comment:            The GeneXpert MRSA Assay (FDA     approved for NASAL specimens     only), is one component of a     comprehensive MRSA colonization     surveillance program. It is not     intended to diagnose MRSA     infection nor to guide or     monitor treatment for     MRSA infections.  URINE CULTURE     Status: None   Collection Time    06/06/13 10:47 AM      Result Value Ref Range Status   Specimen Description URINE, RANDOM   Final   Special Requests NONE   Final   Culture  Setup Time     Final   Value: 06/06/2013 14:50     Performed at [place]     Final   Value: NO GROWTH     Performed at [facility]   Culture     Final   Value: NO GROWTH     Performed at [facility]   Report Status 06/07/2013 FINAL   Final    RADIOLOGY STUDIES/RESULTS: Dg Chest 2  View  05/19/2013   CLINICAL DATA:  Left foot pain.  Confusion and combative  EXAM: CHEST  2 VIEW  COMPARISON:  03/28/2013  FINDINGS: Normal heart size. No pleural effusion or edema. Lung volumes appear low. Mild spondylosis identified within the thoracic spine.  IMPRESSION: 1. Low lung volumes.   Electronically Signed   By: Kerby Moors M.D.   On: 05/19/2013 15:17   Dg Hip Complete Right  06/05/2013   CLINICAL DATA Right hip pain  EXAM RIGHT HIP - COMPLETE 2+ VIEW  COMPARISON None.  FINDINGS Mild degenerative changes of the right hip joint are noted. No acute fracture dislocation is noted. No gross soft tissue abnormality is seen.  IMPRESSION No acute abnormality noted.  SIGNATURE  Electronically Signed   By: Inez Catalina M.D.   On: 06/05/2013 14:31   Ct Head Wo Contrast  06/05/2013   CLINICAL DATA Altered mental status  EXAM CT HEAD WITHOUT CONTRAST  TECHNIQUE Contiguous axial images were obtained from the base of the skull through the vertex without intravenous contrast.  COMPARISON CT 05/19/2013  FINDINGS Generalized atrophy. Chronic microvascular ischemic changes. Negative for acute infarct. Negative for hemorrhage or mass.  Poorly developed mastoid tip on the right likely related to childhood infections. No acute bony change. Retention cyst right maxillary sinus.  IMPRESSION Atrophy and chronic microvascular ischemia.  No acute abnormality.  SIGNATURE  Electronically Signed   By: Franchot Gallo M.D.   On: 06/05/2013 14:55   Ct Head Wo Contrast  05/19/2013   CLINICAL DATA:  [place] [facility] at nursing home, history dementia, diabetes, hypertension, prostate cancer  EXAM: CT HEAD WITHOUT CONTRAST  CT CERVICAL SPINE WITHOUT CONTRAST  TECHNIQUE: Multidetector CT imaging of the head and cervical spine was performed following the standard protocol without intravenous contrast. Multiplanar CT image reconstructions of  the cervical spine were also generated.  COMPARISON:  CT head 12/26/2011  FINDINGS: CT HEAD FINDINGS   Generalized atrophy.  Normal ventricular morphology.  No midline shift or mass effect.  Small vessel chronic ischemic changes of deep cerebral white matter.  No intracranial hemorrhage, mass lesion, or acute infarction.  Mucosal retention cyst right maxillary sinus.  Visualized paranasal sinuses and mastoid air cells otherwise clear.  Bones unremarkable.  Minimal atherosclerotic calcification carotid siphons.  CT CERVICAL SPINE FINDINGS  Prevertebral soft tissues normal thickness.  Osseous mineralization normal.  Scattered endplate spur formation and disc space narrowing throughout cervical spine.  Scattered facet degenerative changes bilaterally with probable ankylosis of the left C3-C4 facet joint.  Vertebral body heights maintained without fracture or subluxation.  Visualized skullbase intact.  Questionable subtle lucent lesion within the spinous process of C7, cannot exclude lytic/destructive lesion.  Lung apices clear.  IMPRESSION: No acute intracranial abnormalities.  Atrophy with minimal small vessel chronic ischemic changes of deep cerebral white matter.  Scattered degenerative disc and facet disease to changes throughout cervical spine.  No evidence acute cervical spine injury.  Questionable lucent process within the C7 spinous process, nonspecific, cannot exclude a lytic metastatic lesion ; this would be atypical for prostate cancer which typically causes sclerotic metastases.   Electronically Signed   By: Lavonia Dana M.D.   On: 05/19/2013 15:02   Ct Cervical Spine Wo Contrast  05/19/2013   CLINICAL DATA:  [place] [facility] at nursing home, history dementia, diabetes, hypertension, prostate cancer  EXAM: CT HEAD WITHOUT CONTRAST  CT CERVICAL SPINE WITHOUT CONTRAST  TECHNIQUE: Multidetector CT imaging of the head and cervical spine was performed following the standard protocol without intravenous contrast. Multiplanar CT image reconstructions of the cervical spine were also generated.  COMPARISON:  CT head 12/26/2011   FINDINGS: CT HEAD FINDINGS  Generalized atrophy.  Normal ventricular morphology.  No midline shift or mass effect.  Small vessel chronic ischemic changes of deep cerebral white matter.  No intracranial hemorrhage, mass lesion, or acute infarction.  Mucosal retention cyst right maxillary sinus.  Visualized paranasal sinuses and mastoid air cells otherwise clear.  Bones unremarkable.  Minimal atherosclerotic calcification carotid siphons.  CT CERVICAL SPINE FINDINGS  Prevertebral soft tissues normal thickness.  Osseous mineralization normal.  Scattered endplate spur formation and disc space narrowing throughout cervical spine.  Scattered facet degenerative changes bilaterally with probable ankylosis of the left C3-C4 facet joint.  Vertebral body heights maintained without fracture or subluxation.  Visualized skullbase intact.  Questionable subtle lucent lesion within the spinous process of C7, cannot exclude lytic/destructive lesion.  Lung apices clear.  IMPRESSION: No acute intracranial abnormalities.  Atrophy with minimal small vessel chronic ischemic changes of deep cerebral white matter.  Scattered degenerative disc and facet disease to changes throughout cervical spine.  No evidence acute cervical spine injury.  Questionable lucent process within the C7 spinous process, nonspecific, cannot exclude a lytic metastatic lesion ; this would be atypical for prostate cancer which typically causes sclerotic metastases.   Electronically Signed   By: Lavonia Dana M.D.   On: 05/19/2013 15:02   Dg Chest Portable 1 View  06/05/2013   CLINICAL DATA Altered mental status.  EXAM PORTABLE CHEST - 1 VIEW  COMPARISON 05/19/2013  FINDINGS Bibasilar atelectasis. Heart is upper limits normal in size. No effusions. No acute bony abnormality.  IMPRESSION Bibasilar atelectasis.  SIGNATURE  Electronically Signed   By: Rolm Baptise M.D.   On: 06/05/2013 13:39   Dg  Knee Complete 4 Views Right  06/05/2013   CLINICAL DATA Right knee  pain  EXAM RIGHT KNEE - COMPLETE 4+ VIEW  COMPARISON None.  FINDINGS Mild osteophytic changes are noted medially. Some meniscal calcifications are seen. No sizable joint effusion is noted. Soft tissue swelling is noted. No fractures are seen.  IMPRESSION Chronic changes without acute abnormality.  SIGNATURE  Electronically Signed   By: Inez Catalina M.D.   On: 06/05/2013 14:32   Dg Foot Complete Left  05/19/2013   CLINICAL DATA:  Fall, left foot pain  EXAM: LEFT FOOT - COMPLETE 3+ VIEW  COMPARISON:  None.  FINDINGS: Bones are osteopenic. Nondisplaced fracture of the left fifth toe proximal phalanx noted. Degenerative changes of the left first MTP joint. Metatarsals appear intact. No definite soft tissue abnormality.  IMPRESSION: Nondisplaced fracture left fifth toe proximal phalanx.   Electronically Signed   By: Daryll Brod M.D.   On: 05/19/2013 15:21    Oren Binet, MD  [facility] Pager:[phone]  If 7PM-7AM, please contact night-coverage [email or web address] Password [email or web address] 06/09/2013, 8:50 AM   LOS: 4 days

## 2013-06-10 LAB — GLUCOSE, CAPILLARY: GLUCOSE-CAPILLARY: 67 mg/dL — AB (ref 70–99)

## 2013-06-10 MED ORDER — LORAZEPAM 1 MG PO TABS: 1.0000 mg | ORAL_TABLET | Freq: Three times a day (TID) | ORAL | Status: AC

## 2013-06-10 MED ORDER — LORAZEPAM 2 MG/ML IJ SOLN: 0.5000 mg | Freq: Four times a day (QID) | INTRAMUSCULAR | Status: AC | PRN

## 2013-06-10 MED ORDER — MORPHINE SULFATE 10 MG/5ML PO SOLN: 5.0000 mg | ORAL | Status: AC | PRN

## 2013-06-10 MED ORDER — LORAZEPAM 1 MG PO TABS
1.0000 mg | ORAL_TABLET | Freq: Three times a day (TID) | ORAL | Status: DC
  Administered 2013-06-10: 1 mg via ORAL
  Filled 2013-06-10: qty 1

## 2013-06-10 MED ORDER — AMLODIPINE BESYLATE 5 MG PO TABS: 5.0000 mg | ORAL_TABLET | Freq: Every day | ORAL | Status: AC

## 2013-06-10 NOTE — Clinical Social Work Note (Deleted)
Request clinical documentation faxed to [facility] in [place] to determine if patient will be eligible for placement at this facility. Patient's information will also be faxed to [facility] in case it is not possible to place patient at the [facility]  Liz [place], [place], [place], [phone]

## 2013-06-10 NOTE — Discharge Summary (Signed)
PATIENT DETAILS Name: [name] Age: 80 y.o. Sex: male Date of Birth: 05-11-1914 MRN: [id number]. Admit Date: 06/05/2013 Admitting Physician: No admitting provider for patient encounter. [id number] DAVID, MD  Recommendations for Outpatient Follow-up:  1. Optimize Comfort medications 2. Comfort feeds with Dysphagia 1 diet  PRIMARY DISCHARGE DIAGNOSIS:  Principal Problem:   Encephalopathy acute Active Problems:   DM   HYPERTENSION   CHRONIC KIDNEY DISEASE UNSPECIFIED   Dementia   GERD (gastroesophageal reflux disease)   Acute encephalopathy      PAST MEDICAL HISTORY: Past Medical History  Diagnosis Date  . Dementia   . Diabetes mellitus   . Hypertension   . Cancer     prostrate  . Cirrhosis   . GERD (gastroesophageal reflux disease)     DISCHARGE MEDICATIONS:   Medication List    STOP taking these medications       acetaminophen 650 MG CR tablet  Commonly known as:  TYLENOL     aspirin EC 81 MG tablet     gabapentin 300 MG capsule  Commonly known as:  NEURONTIN     LANTUS SOLOSTAR 100 UNIT/ML Solostar Pen  Generic drug:  Insulin Glargine     Magnesium 500 MG Tabs     metFORMIN 500 MG 24 hr tablet  Commonly known as:  GLUCOPHAGE-XR     Vitamin B Complex Tabs     Vitamin D-3 5000 UNITS Tabs     white petrolatum Gel  Commonly known as:  VASELINE      TAKE these medications       amLODipine 5 MG tablet  Commonly known as:  NORVASC  Take 1 tablet (5 mg total) by mouth daily.     atenolol 100 MG tablet  Commonly known as:  TENORMIN  Take 100 mg by mouth every evening.     benazepril 20 MG tablet  Commonly known as:  LOTENSIN  Take 20 mg by mouth daily.     LORazepam 2 MG/ML injection  Commonly known as:  ATIVAN  Inject 0.25 mLs (0.5 mg total) into the vein every 6 (six) hours as needed for anxiety or sedation.     LORazepam 1 MG tablet  Commonly known as:  ATIVAN  Take 1 tablet (1 mg total) by mouth 3 (three) times daily.       morphine 10 MG/5ML solution  Take 2.5 mLs (5 mg total) by mouth every 2 (two) hours as needed for severe pain.        ALLERGIES:   Allergies  Allergen Reactions  . Allegra [Fexofenadine Hcl] Other (See Comments)    LBP    BRIEF HPI:  See H&P, Labs, Consult and Test reports for all details in brief, [name] is a 80 y.o. male with pmh significant for dementia, HTN, DM and CKD stage 3-4; transferred from SNF where he resides due to changes in mental status. Patient at baseline conversant and able to follow commands; on admission found to be unable to follow commands and barely speaking and just moaning.  CONSULTATIONS:   Palliative Care Medicine  PERTINENT RADIOLOGIC STUDIES: Dg Chest 2 View  05/19/2013   CLINICAL DATA:  Left foot pain.  Confusion and combative  EXAM: CHEST  2 VIEW  COMPARISON:  03/28/2013  FINDINGS: Normal heart size. No pleural effusion or edema. Lung volumes appear low. Mild spondylosis identified within the thoracic spine.  IMPRESSION: 1. Low lung volumes.   Electronically Signed   By: Queen Slough.D.  On: 05/19/2013 15:17   Dg Hip Complete Right  06/05/2013   CLINICAL DATA Right hip pain  EXAM RIGHT HIP - COMPLETE 2+ VIEW  COMPARISON None.  FINDINGS Mild degenerative changes of the right hip joint are noted. No acute fracture dislocation is noted. No gross soft tissue abnormality is seen.  IMPRESSION No acute abnormality noted.  SIGNATURE  Electronically Signed   By: Inez Catalina M.D.   On: 06/05/2013 14:31   Ct Head Wo Contrast  06/05/2013   CLINICAL DATA Altered mental status  EXAM CT HEAD WITHOUT CONTRAST  TECHNIQUE Contiguous axial images were obtained from the base of the skull through the vertex without intravenous contrast.  COMPARISON CT 05/19/2013  FINDINGS Generalized atrophy. Chronic microvascular ischemic changes. Negative for acute infarct. Negative for hemorrhage or mass.  Poorly developed mastoid tip on the right likely related to  childhood infections. No acute bony change. Retention cyst right maxillary sinus.  IMPRESSION Atrophy and chronic microvascular ischemia.  No acute abnormality.  SIGNATURE  Electronically Signed   By: Franchot Gallo M.D.   On: 06/05/2013 14:55   Ct Head Wo Contrast  05/19/2013   CLINICAL DATA:  [facility] at nursing home, history dementia, diabetes, hypertension, prostate cancer  EXAM: CT HEAD WITHOUT CONTRAST  CT CERVICAL SPINE WITHOUT CONTRAST  TECHNIQUE: Multidetector CT imaging of the head and cervical spine was performed following the standard protocol without intravenous contrast. Multiplanar CT image reconstructions of the cervical spine were also generated.  COMPARISON:  CT head 12/26/2011  FINDINGS: CT HEAD FINDINGS  Generalized atrophy.  Normal ventricular morphology.  No midline shift or mass effect.  Small vessel chronic ischemic changes of deep cerebral white matter.  No intracranial hemorrhage, mass lesion, or acute infarction.  Mucosal retention cyst right maxillary sinus.  Visualized paranasal sinuses and mastoid air cells otherwise clear.  Bones unremarkable.  Minimal atherosclerotic calcification carotid siphons.  CT CERVICAL SPINE FINDINGS  Prevertebral soft tissues normal thickness.  Osseous mineralization normal.  Scattered endplate spur formation and disc space narrowing throughout cervical spine.  Scattered facet degenerative changes bilaterally with probable ankylosis of the left C3-C4 facet joint.  Vertebral body heights maintained without fracture or subluxation.  Visualized skullbase intact.  Questionable subtle lucent lesion within the spinous process of C7, cannot exclude lytic/destructive lesion.  Lung apices clear.  IMPRESSION: No acute intracranial abnormalities.  Atrophy with minimal small vessel chronic ischemic changes of deep cerebral white matter.  Scattered degenerative disc and facet disease to changes throughout cervical spine.  No evidence acute cervical spine injury.   Questionable lucent process within the C7 spinous process, nonspecific, cannot exclude a lytic metastatic lesion ; this would be atypical for prostate cancer which typically causes sclerotic metastases.   Electronically Signed   By: Lavonia Dana M.D.   On: 05/19/2013 15:02   Ct Cervical Spine Wo Contrast  05/19/2013   CLINICAL DATA:  [facility] at nursing home, history dementia, diabetes, hypertension, prostate cancer  EXAM: CT HEAD WITHOUT CONTRAST  CT CERVICAL SPINE WITHOUT CONTRAST  TECHNIQUE: Multidetector CT imaging of the head and cervical spine was performed following the standard protocol without intravenous contrast. Multiplanar CT image reconstructions of the cervical spine were also generated.  COMPARISON:  CT head 12/26/2011  FINDINGS: CT HEAD FINDINGS  Generalized atrophy.  Normal ventricular morphology.  No midline shift or mass effect.  Small vessel chronic ischemic changes of deep cerebral white matter.  No intracranial hemorrhage, mass lesion, or acute infarction.  Mucosal retention  cyst right maxillary sinus.  Visualized paranasal sinuses and mastoid air cells otherwise clear.  Bones unremarkable.  Minimal atherosclerotic calcification carotid siphons.  CT CERVICAL SPINE FINDINGS  Prevertebral soft tissues normal thickness.  Osseous mineralization normal.  Scattered endplate spur formation and disc space narrowing throughout cervical spine.  Scattered facet degenerative changes bilaterally with probable ankylosis of the left C3-C4 facet joint.  Vertebral body heights maintained without fracture or subluxation.  Visualized skullbase intact.  Questionable subtle lucent lesion within the spinous process of C7, cannot exclude lytic/destructive lesion.  Lung apices clear.  IMPRESSION: No acute intracranial abnormalities.  Atrophy with minimal small vessel chronic ischemic changes of deep cerebral white matter.  Scattered degenerative disc and facet disease to changes throughout cervical spine.  No evidence  acute cervical spine injury.  Questionable lucent process within the C7 spinous process, nonspecific, cannot exclude a lytic metastatic lesion ; this would be atypical for prostate cancer which typically causes sclerotic metastases.   Electronically Signed   By: Ulyses Southward M.D.   On: 05/19/2013 15:02   Dg Chest Portable 1 View  06/05/2013   CLINICAL DATA Altered mental status.  EXAM PORTABLE CHEST - 1 VIEW  COMPARISON 05/19/2013  FINDINGS Bibasilar atelectasis. Heart is upper limits normal in size. No effusions. No acute bony abnormality.  IMPRESSION Bibasilar atelectasis.  SIGNATURE  Electronically Signed   By: Charlett Nose M.D.   On: 06/05/2013 13:39   Dg Knee Complete 4 Views Right  06/05/2013   CLINICAL DATA Right knee pain  EXAM RIGHT KNEE - COMPLETE 4+ VIEW  COMPARISON None.  FINDINGS Mild osteophytic changes are noted medially. Some meniscal calcifications are seen. No sizable joint effusion is noted. Soft tissue swelling is noted. No fractures are seen.  IMPRESSION Chronic changes without acute abnormality.  SIGNATURE  Electronically Signed   By: Alcide Clever M.D.   On: 06/05/2013 14:32   Dg Foot Complete Left  05/19/2013   CLINICAL DATA:  Fall, left foot pain  EXAM: LEFT FOOT - COMPLETE 3+ VIEW  COMPARISON:  None.  FINDINGS: Bones are osteopenic. Nondisplaced fracture of the left fifth toe proximal phalanx noted. Degenerative changes of the left first MTP joint. Metatarsals appear intact. No definite soft tissue abnormality.  IMPRESSION: Nondisplaced fracture left fifth toe proximal phalanx.   Electronically Signed   By: Ruel Favors M.D.   On: 05/19/2013 15:21   Dg Swallowing Func-speech Pathology  06/07/2013   Riley Nearing Deblois, CCC-SLP     06/07/2013 12:21 PM Objective Swallowing Evaluation: Modified Barium Swallowing Study   Patient Details  Name: [name] MRN: [id number] Date of Birth: 12/07/1914  Today's Date: 06/07/2013 Time: 0160-1093 SLP Time Calculation (min): 30 min  Past  Medical History:  Past Medical History  Diagnosis Date  . Dementia   . Diabetes mellitus   . Hypertension   . Cancer     prostrate  . Cirrhosis   . GERD (gastroesophageal reflux disease)    Past Surgical History:  Past Surgical History  Procedure Laterality Date  . Appendectomy     HPI:  80 y.o. male with pmh significant for dementia, HTN, DM and CKD  stage 3-4; transferred from SNF where he resides due to changes  in mental status on 3./11. SNF reports baseline conversant and  able to follow commands; Probably UTI. Family member reports pt  typically able to eat soft foods and thin liquids. She observed  coughing and choking, wet vocal quality with breakfast.  Assessment / Plan / Recommendation Clinical Impression  Dysphagia Diagnosis: Moderate oral phase dysphagia;Moderate  pharyngeal phase dysphagia Clinical impression: Pt presents with a moderate oral and  oropharyngeal dysphagia impaired by cognitive decline. Pt has  reduced awreness of boluses and insufficient attention to form  and fully transit thin and solids boluses, leaving oral residuals  after the swallow. Swallow initiation is severely delayed and as  boluses sit in pharynx prior to the swallow, the pt penetrates or  aspirates. Even when the swallow is triggered, oral residuals  fall to pharynx and are also penetrated or aspirated silently.  SLP made max attempts to cue swallow response (verbal/tactile,  dry spoon, next bolus). Pt could not respond to cues. Per the pts  POA, his responsiveness to PO has declined over the past week. He  does not have a history of dysphagia, which is consistent with  the finding of adequate strength but cognitive decline. Recommend  keeping pt NPO for 1-2 days as pt gets treatment. Any diet would  lead to high risk of aspiration, but will allow meds crushed in  puree if needed. Hopeful that responsiveness will improve. POA in  agreement with plan, she states pt would not want even a  temporary feeding tube. SLP to  f/u tomorrow.     Treatment Recommendation  Therapy as outlined in treatment plan below    Diet Recommendation NPO except meds   Medication Administration: Crushed with puree Compensations: Multiple dry swallows after each bite/sip Postural Changes and/or Swallow Maneuvers: Seated upright 90  degrees    Other  Recommendations Recommended Consults: MBS Oral Care Recommendations: Oral care Q4 per protocol   Follow Up Recommendations  Skilled Nursing facility    Frequency and Duration min 2x/week  2 weeks   Pertinent Vitals/Pain NA    SLP Swallow Goals     General HPI: 80 y.o. male with pmh significant for dementia, HTN,  DM and CKD stage 3-4; transferred from SNF where he resides due  to changes in mental status on 3./11. SNF reports baseline  conversant and able to follow commands; Probably UTI. Family  member reports pt typically able to eat soft foods and thin  liquids. She observed coughing and choking, wet vocal quality  with breakfast.  Type of Study: Modified Barium Swallowing Study Reason for Referral: Objectively evaluate swallowing function Previous Swallow Assessment: none Diet Prior to this Study: NPO Temperature Spikes Noted: No Respiratory Status: Room air History of Recent Intubation: No Behavior/Cognition: Alert;Doesn't follow directions Oral Cavity - Dentition: Adequate natural dentition Oral Motor / Sensory Function: Within functional limits Self-Feeding Abilities: Total assist Patient Positioning: Upright in chair Baseline Vocal Quality: Wet Volitional Cough: Cognitively unable to elicit Volitional Swallow: Unable to elicit Anatomy: Within functional limits Pharyngeal Secretions: Not observed secondary MBS    Reason for Referral Objectively evaluate swallowing function   Oral Phase Oral Preparation/Oral Phase Oral Phase: Impaired Oral - Nectar Oral - Nectar Cup: Weak lingual manipulation;Reduced posterior  propulsion;Lingual/palatal residue;Delayed oral transit Oral - Nectar Straw: Weak lingual  manipulation;Reduced posterior  propulsion;Lingual/palatal residue;Delayed oral transit Oral - Thin Oral - Thin Cup: Weak lingual manipulation;Reduced posterior  propulsion;Lingual/palatal residue;Delayed oral transit Oral - Thin Straw: Weak lingual manipulation;Reduced posterior  propulsion;Lingual/palatal residue;Delayed oral transit Oral - Solids Oral - Puree: Weak lingual manipulation;Reduced posterior  propulsion;Lingual/palatal residue;Delayed oral transit Oral - Mechanical Soft: Weak lingual manipulation;Reduced  posterior propulsion;Lingual/palatal residue;Delayed oral  transit;Impaired mastication   Pharyngeal Phase Pharyngeal Phase Pharyngeal Phase: Impaired Pharyngeal - Nectar  Pharyngeal - Nectar Cup: Delayed swallow initiation;Premature  spillage to pyriform sinuses;Penetration/Aspiration during  swallow;Pharyngeal residue - valleculae;Pharyngeal residue -  pyriform sinuses Penetration/Aspiration details (nectar cup): Material enters  airway, passes BELOW cords without attempt by patient to eject  out (silent aspiration);Material does not enter airway Pharyngeal - Nectar Straw: Delayed swallow initiation;Premature  spillage to pyriform sinuses;Penetration/Aspiration during  swallow;Pharyngeal residue - valleculae;Pharyngeal residue -  pyriform sinuses Penetration/Aspiration details (nectar straw): Material does not  enter airway;Material enters airway, passes BELOW cords without  attempt by patient to eject out (silent aspiration) Pharyngeal - Thin Pharyngeal - Thin Cup: Delayed swallow initiation;Premature  spillage to pyriform sinuses;Penetration/Aspiration during  swallow;Pharyngeal residue - valleculae;Pharyngeal residue -  pyriform sinuses Penetration/Aspiration details (thin cup): Material does not  enter airway;Material enters airway, passes BELOW cords without  attempt by patient to eject out (silent aspiration) Pharyngeal - Thin Straw: Delayed swallow initiation;Premature  spillage to pyriform  sinuses;Penetration/Aspiration during  swallow;Pharyngeal residue - valleculae;Pharyngeal residue -  pyriform sinuses Penetration/Aspiration details (thin straw): Material does not  enter airway;Material enters airway, passes BELOW cords without  attempt by patient to eject out (silent aspiration) Pharyngeal - Solids Pharyngeal - Puree: Delayed swallow initiation;Premature spillage  to pyriform sinuses;Pharyngeal residue - valleculae;Pharyngeal  residue - pyriform sinuses Penetration/Aspiration details (puree): Material does not enter  airway Pharyngeal - Mechanical Soft: Delayed swallow  initiation;Premature spillage to valleculae;Pharyngeal residue -  valleculae  Cervical Esophageal Phase    GO             Herbie Baltimore, MA CCC-SLP [phone]  Lynann Beaver 06/07/2013, 12:17 PM      PERTINENT LAB RESULTS: CBC:  Recent Labs  06/08/13 0700  WBC 9.5  HGB 11.4*  HCT 34.3*  PLT 219   CMET CMP     Component Value Date/Time   NA 139 06/08/2013 0700   K 3.7 06/08/2013 0700   CL 102 06/08/2013 0700   CO2 22 06/08/2013 0700   GLUCOSE 141* 06/08/2013 0700   BUN 20 06/08/2013 0700   CREATININE 1.29 06/08/2013 0700   CREATININE 1.47* 04/04/2013 1302   CALCIUM 8.3* 06/08/2013 0700   PROT 7.0 06/05/2013 1302   ALBUMIN 2.9* 06/05/2013 1302   AST 13 06/05/2013 1302   ALT 12 06/05/2013 1302   ALKPHOS 105 06/05/2013 1302   BILITOT 0.4 06/05/2013 1302   GFRNONAA 45* 06/08/2013 0700   GFRAA 52* 06/08/2013 0700    GFR Estimated Creatinine Clearance: 30.7 ml/min (by C-G formula based on Cr of 1.29). No results found for this basename: LIPASE, AMYLASE,  in the last 72 hours No results found for this basename: CKTOTAL, CKMB, CKMBINDEX, TROPONINI,  in the last 72 hours No components found with this basename: POCBNP,  No results found for this basename: DDIMER,  in the last 72 hours No results found for this basename: HGBA1C,  in the last 72 hours No results found for this basename: CHOL, HDL, LDLCALC,  TRIG, CHOLHDL, LDLDIRECT,  in the last 72 hours No results found for this basename: TSH, T4TOTAL, FREET3, T3FREE, THYROIDAB,  in the last 72 hours No results found for this basename: VITAMINB12, FOLATE, FERRITIN, TIBC, IRON, RETICCTPCT,  in the last 72 hours Coags: No results found for this basename: PT, INR,  in the last 72 hours Microbiology: Recent Results (from the past 240 hour(s))  MRSA PCR SCREENING     Status: None   Collection Time    06/06/13 12:18 AM      Result Value Ref  Range Status   MRSA by PCR NEGATIVE  NEGATIVE Final   Comment:            The GeneXpert MRSA Assay (FDA     approved for NASAL specimens     only), is one component of a     comprehensive MRSA colonization     surveillance program. It is not     intended to diagnose MRSA     infection nor to guide or     monitor treatment for     MRSA infections.  URINE CULTURE     Status: None   Collection Time    06/06/13 10:47 AM      Result Value Ref Range Status   Specimen Description URINE, RANDOM   Final   Special Requests NONE   Final   Culture  Setup Time     Final   Value: 06/06/2013 14:50     Performed at [place]     Final   Value: NO GROWTH     Performed at [facility]   Culture     Final   Value: NO GROWTH     Performed at [facility]   Report Status 06/07/2013 FINAL   Final     BRIEF HOSPITAL COURSE:  Encephalopathy acute:  - due to presumed UTI.  - CT head (-) for acute changes  - Mental status continues to wax and wane-unfortunately now persistently worsening. See my palliative care medicine on 3/15, no transition to comfort care status. All antibiotics have been discontinued.   UTI  - Admitted, and started on IV Rocephin. Unfortunately urine cultures were drawn after she was started on antibiotics, urine culture negative. As noted above, has continued to deteriorate, and has now been transitioned to comfort status. All antibiotics have now been  discontinued as of 3/15.  DM:  - Initially, patient was continued on Lantus and SSI, however with continued deterioration, we will discontinue all forms of insulin. Stop CBGs. As noted above, patient on comfort care status.   Hypertensive Urgency  -BP continues to be very labile, c/wAtenolol, Benazepril, and Amlodipine as long as possible, his oral intake permits.   Dysphagia  - Likely worsened by acute illness and encephalopathy, suspect some amount of chronic dysphagia given history of dementia and advanced age.  - This M.D., had a long discussion with the patient's healthcare power of attorney at bedside on 3/14, she agrees to comfort feeding, accepts all risks of aspiration. Therefore cautiously resumed a dysphagia 1 diet. RN aware that patient will need assistance with feeding at all times. Unfortunately continues to deteriorate without any significant oral intake for the past few days.  CHRONIC KIDNEY DISEASE UNSPECIFIED:  -stable, given deterioration, we'll not check any further labs. Patient also on comfort care status.   Dementia:  - Comfort care status.   GERD  - Stable, stop PPI and as comfort care status   BPH  - Continue with Flomax as long as possible, may need a Foley catheter for comfort at some point.   Ethics/palliative care  - DO NOT RESUSCITATE  - Now comfort care status  - Likely discharge to residential hospice  TODAY-DAY OF DISCHARGE:  Subjective:   [name] today continues to worsen, very lethargic and now with no significant oral intake over the past few days  Objective:   Blood pressure 170/91, pulse 106, temperature 97.7 F (36.5 C), temperature source Oral, resp. rate 18,  height 5' 3.5" (1.613 m), weight 75.161 kg (165 lb 11.2 oz), SpO2 94.00%.  Intake/Output Summary (Last 24 hours) at 06/10/13 1336 Last data filed at 06/10/13 0528  Gross per 24 hour  Intake     40 ml  Output    800 ml  Net   -760 ml   Filed Weights   06/05/13 2214    Weight: 75.161 kg (165 lb 11.2 oz)    Exam Gen Exam:Very confused and lethargic  Neck: Supple, No JVD.  Chest: B/L Clear.  CVS: S1 S2 Regular  Abdomen: soft, BS +, non tender, non distended.  Extremities: no edema, lower extremities warm to touch.  Neurologic: Non Focal- only all 4 extremities  Skin: No Rash.  Wounds: N/A.   DISCHARGE CONDITION: Stable-but with poor prognoses  DISPOSITION: Residential Hospice  DISCHARGE INSTRUCTIONS:    Activity:  As tolerated   Diet recommendation: Dysphagia 1 diet with full supervision and strict aspiration precautions if mental status permits  CODE STATUS DNR       Discharge Orders   Future Appointments Provider Department Dept Phone   06/11/2013 3:00 PM Vicie Mutters, PA-C [place] [facility] [phone]   08/13/2013 10:00 AM Ardis Hughs, PA-C [place] [facility] [phone]   11/14/2013 10:30 AM Unk Pinto, MD [place] [facility] [phone]   Future Orders Complete By Expires   Diet general  As directed    Scheduling Instructions:     Dysphagia 1 diet with aspiration precautions       Total Time spent on discharge equals 45 minutes.  SignedOren Binet 06/10/2013 1:36 PM

## 2013-06-10 NOTE — Progress Notes (Signed)
Hospice nurse called for report.  Hospice nurse requested that patient be sent to facility with his saline lock intact and condom cath on.

## 2013-06-10 NOTE — Progress Notes (Signed)
PATIENT DETAILS Name: [name] Age: 80 y.o. Sex: male Date of Birth: Jun 28, 1914 Admit Date: 06/05/2013 Admitting Physician No admitting provider for patient encounter. [id number] DAVID, MD  Subjective: Confused, lethargic this morning. Hardly any oralintake.  Assessment/Plan: Encephalopathy acute:  - due to presumed UTI.  - CT head (-) for acute changes  - Mental status continues to wax and wane-unfortunately now persistently worsening. See my palliative care medicine on 3/15, no transition to comfort care status. All antibiotics have been discontinued.  UTI  - Admitted, and started on IV Rocephin. Unfortunately urine cultures were drawn after she was started on antibiotics, urine culture negative. As noted above, has continued to deteriorate, and has now been transitioned to comfort status. All antibiotics have now been discontinued as of 3/15.Marland Kitchen   DM:  - Initially, patient was continued on Lantus and SSI, however with continued deterioration, we will discontinue all forms of insulin. Stop CBGs. As noted above, patient on comfort care status.  Hypertensive Urgency  -BP continues to be very labile, c/wAtenolol, Benazepril, and Amlodipine as long as possible, his oral intake permits. -PRN hydralazine ordered   Dysphagia - Likely worsened by acute illness and encephalopathy, suspect some amount of chronic dysphagia given history of dementia and advanced age. - This M.D., had a long discussion with the patient's healthcare power of attorney at bedside on 3/14, she agrees to comfort feeding, accepts all risks of aspiration. Therefore we'll cautiously resume a dysphagia 1 diet. RN aware that patient will need assistance with feeding at all times.  CHRONIC KIDNEY DISEASE UNSPECIFIED:  -stable, given deterioration, we'll not check any further labs. Patient also on comfort care status.  Dementia:  - Comfort care status.  GERD  - Stable, stop PPI and as comfort care  status  BPH  - Continue with Flomax as long as possible, may need a Foley catheter for comfort at some point.  Ethics/palliative care - DO NOT RESUSCITATE - Now comfort care status - Likely discharge to residential hospice  Disposition: Remain inpatient- pending residential hospice bed availability  DVT Prophylaxis: Not needed as comfort care  Code Status: DNR  Family Communication [name] [facility]) [phone]   Procedures:  None  CONSULTS:  Palliative Care   MEDICATIONS: Scheduled Meds: . amLODipine  5 mg Oral Daily  . atenolol  100 mg Oral QPM  . benazepril  20 mg Oral Daily  . LORazepam  1 mg Oral BID  . tamsulosin  0.4 mg Oral Daily   Continuous Infusions:   PRN Meds:.haloperidol lactate, LORazepam, morphine, ondansetron (ZOFRAN) IV, ondansetron  Antibiotics: Anti-infectives   Start     Dose/Rate Route Frequency Ordered Stop   06/05/13 2000  cefTRIAXone (ROCEPHIN) 1 g in dextrose 5 % 50 mL IVPB  Status:  Discontinued     1 g 100 mL/hr over 30 Minutes Intravenous Every 24 hours 06/05/13 1834 06/09/13 1534      PHYSICAL EXAM: Vital signs in last 24 hours: Filed Vitals:   06/09/13 1534 06/09/13 2050 06/10/13 0523 06/10/13 0931  BP: 176/94 175/75 171/80 170/91  Pulse: 68 64 71 106  Temp: 97.7 F (36.5 C) 98.2 F (36.8 C) 97.7 F (36.5 C)   TempSrc: Axillary Oral Oral   Resp: 18 18 18    Height:      Weight:      SpO2: 100% 100% 94%     Weight change:  Filed Weights   06/05/13 2214  Weight: 75.161 kg (  165 lb 11.2 oz)   Body mass index is 28.89 kg/(m^2).   Gen Exam:Very confused and lethargic Neck: Supple, No JVD.   Chest: B/L Clear.  CVS: S1 S2 Regular Abdomen: soft, BS +, non tender, non distended.  Extremities: no edema, lower extremities warm to touch. Neurologic: Non Focal- only all 4 extremities Skin: No Rash.   Wounds: N/A.    Intake/Output from previous day:  Intake/Output Summary (Last 24 hours) at 06/10/13 1056 Last  data filed at 06/10/13 0528  Gross per 24 hour  Intake     40 ml  Output    800 ml  Net   -760 ml     LAB RESULTS: CBC  Recent Labs Lab 06/05/13 1302 06/06/13 0720 06/07/13 0657 06/08/13 0700  WBC 10.0 12.1* 11.3* 9.5  HGB 13.3 12.2* 12.0* 11.4*  HCT 46.3 36.5* 35.6* 34.3*  PLT 155 199 226 219  MCV 98.3 84.5 83.8 84.1  MCH 28.2 28.2 28.2 27.9  MCHC 28.7* 33.4 33.7 33.2  RDW 14.5 13.4 13.8 13.8    Chemistries   Recent Labs Lab 06/05/13 1302 06/06/13 0720 06/07/13 0657 06/08/13 0700  NA 139 138 140 139  K 4.4 4.0 3.7 3.7  CL 98 98 101 102  CO2 28 25 24 22   GLUCOSE 312* 229* 125* 141*  BUN 24* 17 20 20   CREATININE 1.46* 1.20 1.48* 1.29  CALCIUM 9.3 9.0 8.8 8.3*    CBG:  Recent Labs Lab 06/08/13 2147 06/09/13 0755 06/09/13 1250 06/09/13 1707 06/10/13 0805  GLUCAP 183* 171* 163* 75 67*    GFR Estimated Creatinine Clearance: 30.7 ml/min (by C-G formula based on Cr of 1.29).  Coagulation profile  Recent Labs Lab 06/05/13 1302  INR 1.05    Cardiac Enzymes No results found for this basename: CK, CKMB, TROPONINI, MYOGLOBIN,  in the last 168 hours  No components found with this basename: POCBNP,  No results found for this basename: DDIMER,  in the last 72 hours No results found for this basename: HGBA1C,  in the last 72 hours No results found for this basename: CHOL, HDL, LDLCALC, TRIG, CHOLHDL, LDLDIRECT,  in the last 72 hours No results found for this basename: TSH, T4TOTAL, FREET3, T3FREE, THYROIDAB,  in the last 72 hours No results found for this basename: VITAMINB12, FOLATE, FERRITIN, TIBC, IRON, RETICCTPCT,  in the last 72 hours No results found for this basename: LIPASE, AMYLASE,  in the last 72 hours  Urine Studies No results found for this basename: UACOL, UAPR, USPG, UPH, UTP, UGL, UKET, UBIL, UHGB, UNIT, UROB, ULEU, UEPI, UWBC, URBC, UBAC, CAST, CRYS, UCOM, BILUA,  in the last 72 hours  MICROBIOLOGY: Recent Results (from the past 240  hour(s))  MRSA PCR SCREENING     Status: None   Collection Time    06/06/13 12:18 AM      Result Value Ref Range Status   MRSA by PCR NEGATIVE  NEGATIVE Final   Comment:            The GeneXpert MRSA Assay (FDA     approved for NASAL specimens     only), is one component of a     comprehensive MRSA colonization     surveillance program. It is not     intended to diagnose MRSA     infection nor to guide or     monitor treatment for     MRSA infections.  URINE CULTURE     Status: None  Collection Time    06/06/13 10:47 AM      Result Value Ref Range Status   Specimen Description URINE, RANDOM   Final   Special Requests NONE   Final   Culture  Setup Time     Final   Value: 06/06/2013 14:50     Performed at [facility] Count     Final   Value: NO GROWTH     Performed at [facility]   Culture     Final   Value: NO GROWTH     Performed at [facility]   Report Status 06/07/2013 FINAL   Final    RADIOLOGY STUDIES/RESULTS: Dg Chest 2 View  05/19/2013   CLINICAL DATA:  Left foot pain.  Confusion and combative  EXAM: CHEST  2 VIEW  COMPARISON:  03/28/2013  FINDINGS: Normal heart size. No pleural effusion or edema. Lung volumes appear low. Mild spondylosis identified within the thoracic spine.  IMPRESSION: 1. Low lung volumes.   Electronically Signed   By: Signa Kell M.D.   On: 05/19/2013 15:17   Dg Hip Complete Right  06/05/2013   CLINICAL DATA Right hip pain  EXAM RIGHT HIP - COMPLETE 2+ VIEW  COMPARISON None.  FINDINGS Mild degenerative changes of the right hip joint are noted. No acute fracture dislocation is noted. No gross soft tissue abnormality is seen.  IMPRESSION No acute abnormality noted.  SIGNATURE  Electronically Signed   By: Alcide Clever M.D.   On: 06/05/2013 14:31   Ct Head Wo Contrast  06/05/2013   CLINICAL DATA Altered mental status  EXAM CT HEAD WITHOUT CONTRAST  TECHNIQUE Contiguous axial images were obtained from the base of  the skull through the vertex without intravenous contrast.  COMPARISON CT 05/19/2013  FINDINGS Generalized atrophy. Chronic microvascular ischemic changes. Negative for acute infarct. Negative for hemorrhage or mass.  Poorly developed mastoid tip on the right likely related to childhood infections. No acute bony change. Retention cyst right maxillary sinus.  IMPRESSION Atrophy and chronic microvascular ischemia.  No acute abnormality.  SIGNATURE  Electronically Signed   By: Marlan Palau M.D.   On: 06/05/2013 14:55   Ct Head Wo Contrast  05/19/2013   CLINICAL DATA:  [facility] at nursing home, history dementia, diabetes, hypertension, prostate cancer  EXAM: CT HEAD WITHOUT CONTRAST  CT CERVICAL SPINE WITHOUT CONTRAST  TECHNIQUE: Multidetector CT imaging of the head and cervical spine was performed following the standard protocol without intravenous contrast. Multiplanar CT image reconstructions of the cervical spine were also generated.  COMPARISON:  CT head 12/26/2011  FINDINGS: CT HEAD FINDINGS  Generalized atrophy.  Normal ventricular morphology.  No midline shift or mass effect.  Small vessel chronic ischemic changes of deep cerebral white matter.  No intracranial hemorrhage, mass lesion, or acute infarction.  Mucosal retention cyst right maxillary sinus.  Visualized paranasal sinuses and mastoid air cells otherwise clear.  Bones unremarkable.  Minimal atherosclerotic calcification carotid siphons.  CT CERVICAL SPINE FINDINGS  Prevertebral soft tissues normal thickness.  Osseous mineralization normal.  Scattered endplate spur formation and disc space narrowing throughout cervical spine.  Scattered facet degenerative changes bilaterally with probable ankylosis of the left C3-C4 facet joint.  Vertebral body heights maintained without fracture or subluxation.  Visualized skullbase intact.  Questionable subtle lucent lesion within the spinous process of C7, cannot exclude lytic/destructive lesion.  Lung apices clear.   IMPRESSION: No acute intracranial abnormalities.  Atrophy with minimal small vessel  chronic ischemic changes of deep cerebral white matter.  Scattered degenerative disc and facet disease to changes throughout cervical spine.  No evidence acute cervical spine injury.  Questionable lucent process within the C7 spinous process, nonspecific, cannot exclude a lytic metastatic lesion ; this would be atypical for prostate cancer which typically causes sclerotic metastases.   Electronically Signed   By: Lavonia Dana M.D.   On: 05/19/2013 15:02   Ct Cervical Spine Wo Contrast  05/19/2013   CLINICAL DATA:  [place] at nursing home, history dementia, diabetes, hypertension, prostate cancer  EXAM: CT HEAD WITHOUT CONTRAST  CT CERVICAL SPINE WITHOUT CONTRAST  TECHNIQUE: Multidetector CT imaging of the head and cervical spine was performed following the standard protocol without intravenous contrast. Multiplanar CT image reconstructions of the cervical spine were also generated.  COMPARISON:  CT head 12/26/2011  FINDINGS: CT HEAD FINDINGS  Generalized atrophy.  Normal ventricular morphology.  No midline shift or mass effect.  Small vessel chronic ischemic changes of deep cerebral white matter.  No intracranial hemorrhage, mass lesion, or acute infarction.  Mucosal retention cyst right maxillary sinus.  Visualized paranasal sinuses and mastoid air cells otherwise clear.  Bones unremarkable.  Minimal atherosclerotic calcification carotid siphons.  CT CERVICAL SPINE FINDINGS  Prevertebral soft tissues normal thickness.  Osseous mineralization normal.  Scattered endplate spur formation and disc space narrowing throughout cervical spine.  Scattered facet degenerative changes bilaterally with probable ankylosis of the left C3-C4 facet joint.  Vertebral body heights maintained without fracture or subluxation.  Visualized skullbase intact.  Questionable subtle lucent lesion within the spinous process of C7, cannot exclude lytic/destructive  lesion.  Lung apices clear.  IMPRESSION: No acute intracranial abnormalities.  Atrophy with minimal small vessel chronic ischemic changes of deep cerebral white matter.  Scattered degenerative disc and facet disease to changes throughout cervical spine.  No evidence acute cervical spine injury.  Questionable lucent process within the C7 spinous process, nonspecific, cannot exclude a lytic metastatic lesion ; this would be atypical for prostate cancer which typically causes sclerotic metastases.   Electronically Signed   By: Lavonia Dana M.D.   On: 05/19/2013 15:02   Dg Chest Portable 1 View  06/05/2013   CLINICAL DATA Altered mental status.  EXAM PORTABLE CHEST - 1 VIEW  COMPARISON 05/19/2013  FINDINGS Bibasilar atelectasis. Heart is upper limits normal in size. No effusions. No acute bony abnormality.  IMPRESSION Bibasilar atelectasis.  SIGNATURE  Electronically Signed   By: Rolm Baptise M.D.   On: 06/05/2013 13:39   Dg Knee Complete 4 Views Right  06/05/2013   CLINICAL DATA Right knee pain  EXAM RIGHT KNEE - COMPLETE 4+ VIEW  COMPARISON None.  FINDINGS Mild osteophytic changes are noted medially. Some meniscal calcifications are seen. No sizable joint effusion is noted. Soft tissue swelling is noted. No fractures are seen.  IMPRESSION Chronic changes without acute abnormality.  SIGNATURE  Electronically Signed   By: Inez Catalina M.D.   On: 06/05/2013 14:32   Dg Foot Complete Left  05/19/2013   CLINICAL DATA:  Fall, left foot pain  EXAM: LEFT FOOT - COMPLETE 3+ VIEW  COMPARISON:  None.  FINDINGS: Bones are osteopenic. Nondisplaced fracture of the left fifth toe proximal phalanx noted. Degenerative changes of the left first MTP joint. Metatarsals appear intact. No definite soft tissue abnormality.  IMPRESSION: Nondisplaced fracture left fifth toe proximal phalanx.   Electronically Signed   By: Daryll Brod M.D.   On: 05/19/2013 15:21  Oren Binet, MD  [facility] Pager:[phone]  If  7PM-7AM, please contact night-coverage [email or web address] Password [email or web address] 06/10/2013, 10:56 AM   LOS: 5 days

## 2013-06-10 NOTE — Clinical Social Work Note (Signed)
Patient to be discharged to [facility]. RN, patient's HPOA [name], and facility are aware of discharge. DC packet prepared for patient's transport to facility. Number for report listed on DC packet. CSW signing off at this time.  Liz Beach, [place], [place], [phone]

## 2013-06-11 ENCOUNTER — Ambulatory Visit: Payer: Self-pay | Admitting: Physician Assistant

## 2013-06-26 DEATH — deceased

## 2013-08-13 ENCOUNTER — Encounter: Payer: Self-pay | Admitting: Emergency Medicine

## 2013-11-14 ENCOUNTER — Ambulatory Visit: Payer: Self-pay | Admitting: Internal Medicine

## 2015-03-31 IMAGING — CR DG CHEST 2V
2 series · 2 of 2 positions shown · non-contrast
Comparison: 08/04/2012

CLINICAL DATA: Emesis.  Vomiting, nausea.  Body aches.

EXAM:
CHEST  2 VIEW

[w chest lat]
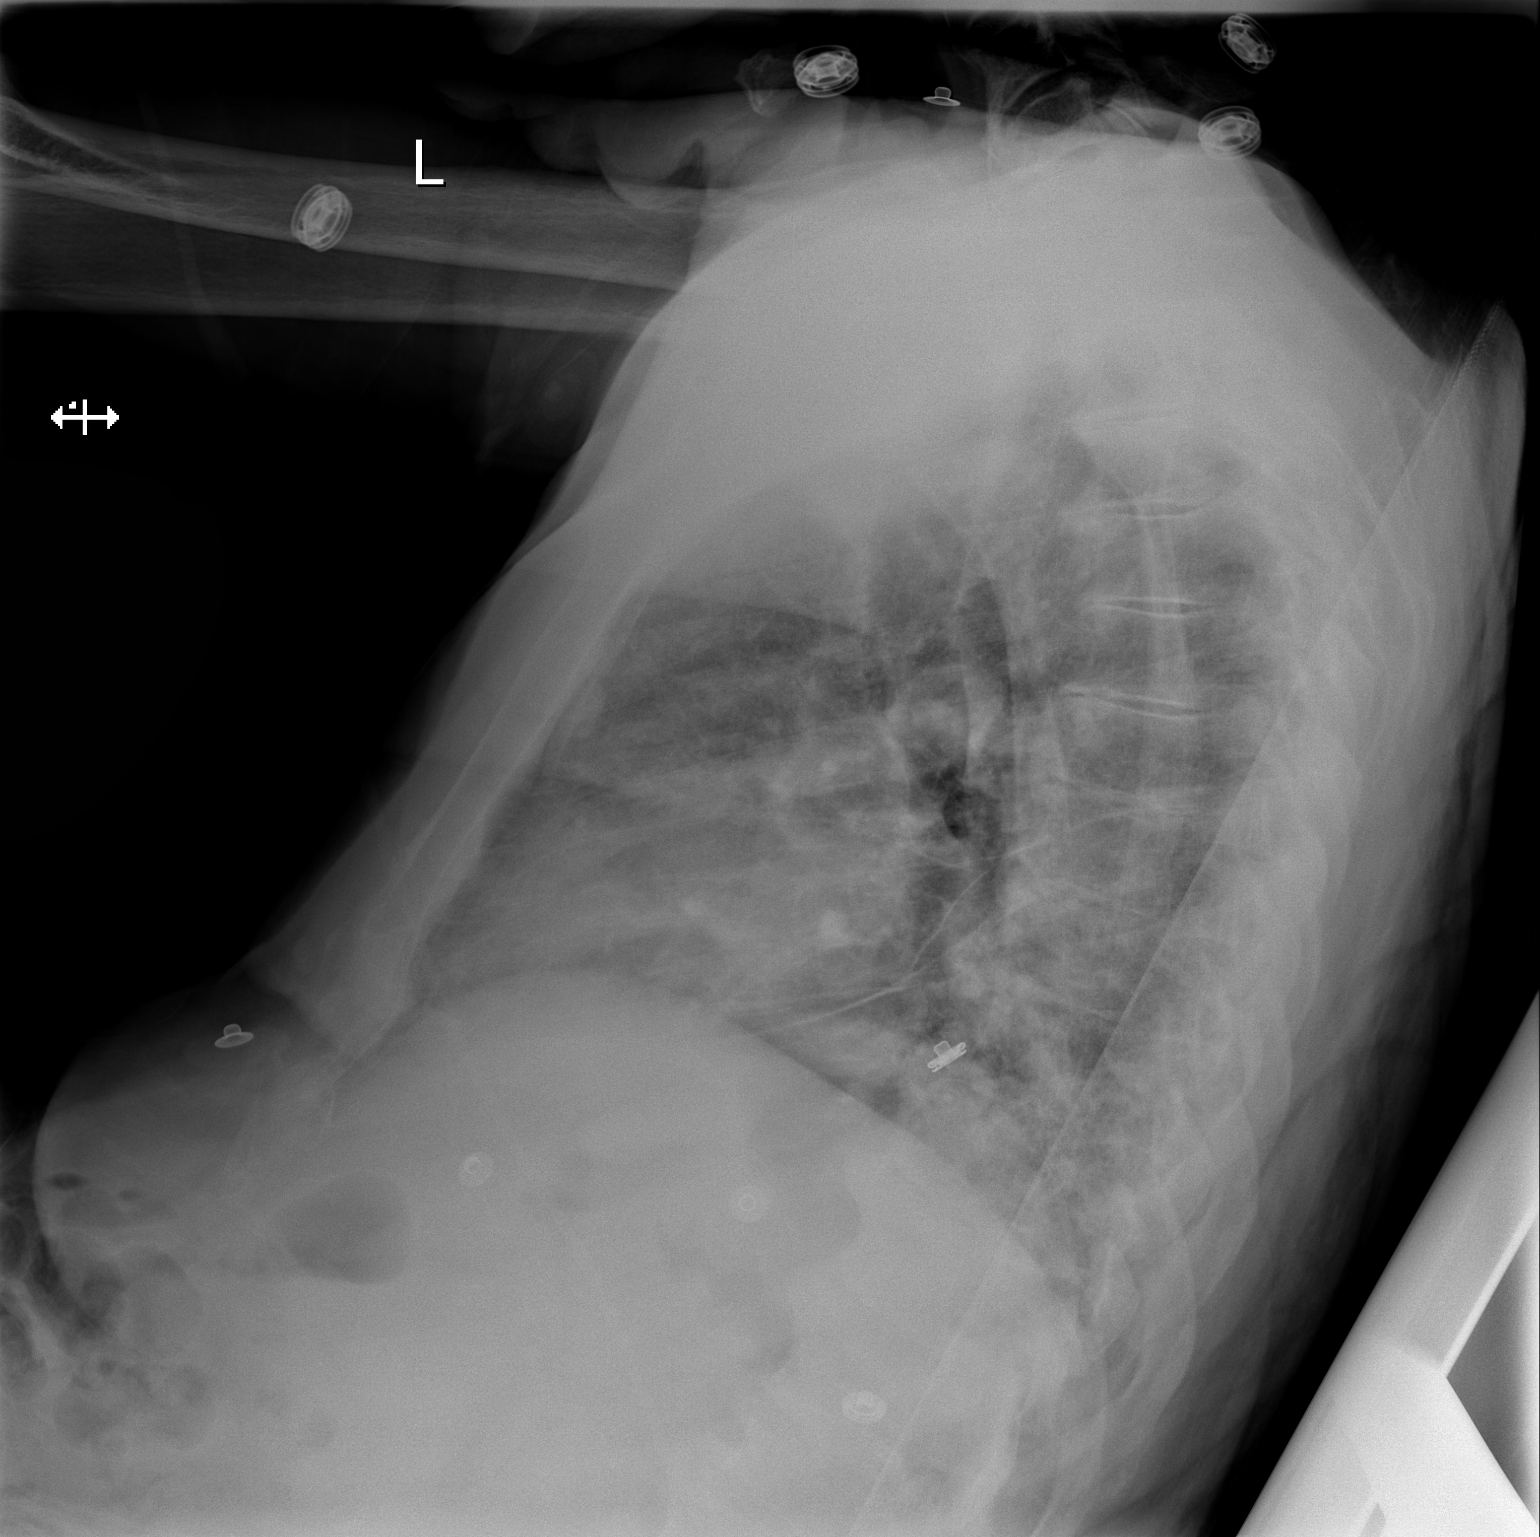

[x chest ap]
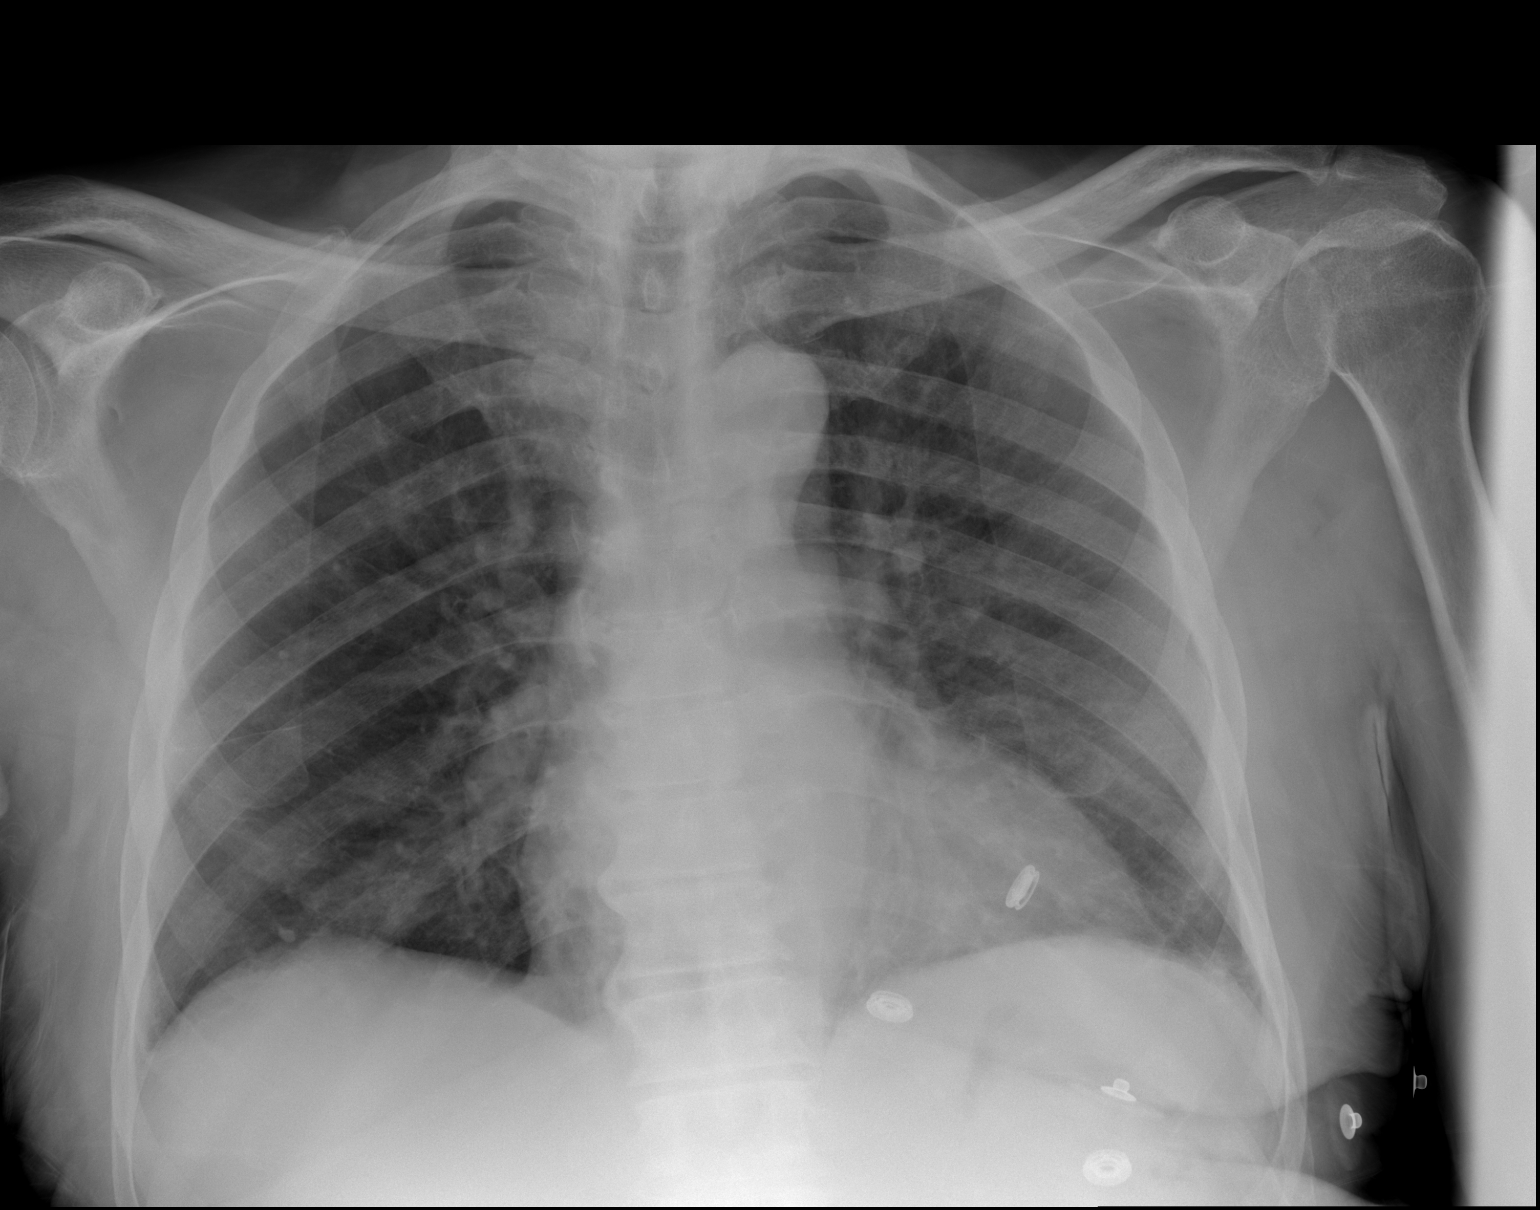

[2 of 2 positions shown; findings below may reference images not displayed]

FINDINGS: The heart size is upper normal. Mediastinal and hilar contours are
normal. There is slight prominence of the pulmonary vascularity.
Lung volumes are low, particularly on the lateral view. There is
some prominence of the peribronchial markings the lung bases, and
this appears similar to prior studies. No consolidation or pleural
effusion. No acute osseous abnormality. Typical degenerative changes
of the thoracic spine noted.
IMPRESSION: Mild peribronchial thickening at the lung bases appears chronic. No
definite acute cardiopulmonary disease.

Mild cardiomegaly.
